# Patient Record
Sex: Male | Born: 1985 | Race: Black or African American | Hispanic: No | Marital: Single | Smoking: Never smoker
Health system: Southern US, Community
[De-identification: ages and names within clinical notes are randomized; demographics above are authoritative.]

## PROBLEM LIST (undated history)

## (undated) DIAGNOSIS — S72009A Fracture of unspecified part of neck of unspecified femur, initial encounter for closed fracture: Secondary | ICD-10-CM

## (undated) DIAGNOSIS — S6990XA Unspecified injury of unspecified wrist, hand and finger(s), initial encounter: Secondary | ICD-10-CM

## (undated) DIAGNOSIS — G4730 Sleep apnea, unspecified: Secondary | ICD-10-CM

## (undated) DIAGNOSIS — S42309A Unspecified fracture of shaft of humerus, unspecified arm, initial encounter for closed fracture: Secondary | ICD-10-CM

## (undated) DIAGNOSIS — F4310 Post-traumatic stress disorder, unspecified: Secondary | ICD-10-CM

---

## 2009-09-14 ENCOUNTER — Emergency Department (HOSPITAL_COMMUNITY): Admission: EM | Admit: 2009-09-14 | Discharge: 2009-09-14 | Payer: Self-pay | Admitting: Family Medicine

## 2009-09-22 ENCOUNTER — Ambulatory Visit (HOSPITAL_COMMUNITY): Admission: RE | Admit: 2009-09-22 | Discharge: 2009-09-22 | Payer: Self-pay | Admitting: Emergency Medicine

## 2009-10-09 ENCOUNTER — Ambulatory Visit (HOSPITAL_COMMUNITY): Admission: RE | Admit: 2009-10-09 | Discharge: 2009-10-09 | Payer: Self-pay | Admitting: Gastroenterology

## 2010-11-30 LAB — PROTIME-INR: Prothrombin Time: 12.3 seconds (ref 11.6–15.2)

## 2010-11-30 LAB — CBC
Hemoglobin: 13.9 g/dL (ref 13.0–17.0)
RBC: 4.49 MIL/uL (ref 4.22–5.81)
WBC: 4 10*3/uL (ref 4.0–10.5)

## 2010-11-30 LAB — APTT: aPTT: 31 seconds (ref 24–37)

## 2013-03-08 ENCOUNTER — Encounter (HOSPITAL_COMMUNITY): Payer: Self-pay | Admitting: Emergency Medicine

## 2013-03-08 ENCOUNTER — Emergency Department (INDEPENDENT_AMBULATORY_CARE_PROVIDER_SITE_OTHER)
Admission: EM | Admit: 2013-03-08 | Discharge: 2013-03-08 | Disposition: A | Discharge status: Home / Self Care | Source: Home / Self Care | Attending: Family Medicine | Admitting: Family Medicine

## 2013-03-08 DIAGNOSIS — M778 Other enthesopathies, not elsewhere classified: Secondary | ICD-10-CM

## 2013-03-08 DIAGNOSIS — M65849 Other synovitis and tenosynovitis, unspecified hand: Secondary | ICD-10-CM

## 2013-03-08 DIAGNOSIS — M65839 Other synovitis and tenosynovitis, unspecified forearm: Secondary | ICD-10-CM

## 2013-03-08 HISTORY — DX: Sleep apnea, unspecified: G47.30

## 2013-03-08 HISTORY — DX: Post-traumatic stress disorder, unspecified: F43.10

## 2013-03-08 MED ORDER — TRAMADOL HCL 50 MG PO TABS: 50.0000 mg | ORAL_TABLET | Freq: Four times a day (QID) | ORAL | Status: DC | PRN

## 2013-03-08 MED ORDER — IBUPROFEN 600 MG PO TABS: 600.0000 mg | ORAL_TABLET | Freq: Three times a day (TID) | ORAL | Status: DC | PRN

## 2013-03-08 NOTE — ED Notes (Signed)
Reports 2 week history of wrist pain.  Patient reports he was working out with [employer]. Patient lifted weights, snapped hand back, hyper extending right wrist.  Patient felt pain and heard pop.  [facility]  Told this was a sprain and given ibuprofen.  Patient continues to have pain and reports he is having difficulty when working.  Patient moves appliances all day at work.

## 2013-03-08 NOTE — ED Notes (Signed)
Verified instructions with dr Alfonse Ras.

## 2013-03-08 NOTE — ED Provider Notes (Signed)
History    CSN: [id number] Arrival date & time 03/08/13  1046  First MD Initiated Contact with Patient 03/08/13 1124     Chief Complaint  Patient presents with  . Wrist Pain   (Consider location/radiation/quality/duration/timing/severity/associated sxs/prior Treatment) HPI Comments: 27 year old  Right-handed male here complaining of right wrist pain for 2 weeks. Patient stated he was lifting weights at the gym and hyperextended his right wrist while holding a heavy load prior his pain started. He was seen at the [facility] and had x-rays that ruled out fractures. He has been taking over-the-counter ibuprofen and was wearing a over-the-counter splint but apparently is too small for patient's size. Denies swelling, redness or increased temperature. No skin breaks. 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  Past Medical History  Diagnosis Date  . Post traumatic stress disorder   . Sleep apnea    History reviewed. No pertinent past surgical history. No family history on file. History  Substance Use Topics  . Smoking status: Never Smoker   . Smokeless tobacco: Not on file  . Alcohol Use: Yes    Review of Systems  Constitutional: Negative for fever and chills.  Skin: Negative for rash and wound.  Neurological: Negative for weakness and numbness.    Allergies  Review of patient's allergies indicates no known allergies.  Home Medications   Current Outpatient Rx  Name  Route  Sig  Dispense  Refill  . SERTRALINE HCL PO   Oral   Take by mouth.         Marland Kitchen ibuprofen (ADVIL,MOTRIN) 600 MG tablet   Oral   Take 1 tablet (600 mg total) by mouth every 8 (eight) hours as needed for pain. Take with food   30 tablet   0   . traMADol (ULTRAM) 50 MG tablet   Oral   Take 1 tablet (50 mg total) by mouth every 6 (six) hours as needed for pain.   15 tablet   0    BP 132/56  Pulse 72   Temp(Src) 97.9 F (36.6 C) (Oral)  Resp 16  SpO2 99% Physical Exam  Nursing note and vitals reviewed. Constitutional: He is oriented to person, place, and time. He appears well-developed and well-nourished. No distress.  HENT:  Head: Normocephalic and atraumatic.  Cardiovascular: Normal heart sounds.   Pulmonary/Chest: Breath sounds normal.  Musculoskeletal:  Right wrist: No obvious deformity, swelling or erythema. Patient able to make a fist, abduct and adduct digits with reported minimal discomfort including  thumb opposition to other digits. No focal tenderness over the dorsal carpal or metacarpal bones. Tenderness to palpation over volar wrist surface on the ulnar side, worse with wrist flexion and extension. Intact 2 point discrimination in the dorsal and palmar aspect of the hand and fingers. Negative Finkelstain's test. Patent radial and ulnar pulses with brisk cap refill at the tip of the fingers. Patient reported pain with hand grip. Strength impressed normal despite discomfort.   Neurological: He is alert and oriented to person, place, and time.  Skin: No rash noted. He is not diaphoretic.  Or wounds    ED Course  Procedures (including critical care time) Labs Reviewed - No data to display No results found. 1. Tendinitis of wrist     MDM  Impress ulnar flexor tendinitis of the right wrist. Proper wrist splint was placed here. Prescribed ibuprofen and tramadol. Supportive  care including rehabilitation exercises and red flags that should prompt his return to medical attention discussed with patient and provided in writing. Work note for modified duty for one week. Followup with sports medicine clinic as needed.  Sharin Grave, MD 03/10/13 [phone]

## 2013-05-30 ENCOUNTER — Emergency Department (HOSPITAL_COMMUNITY)
Admission: EM | Admit: 2013-05-30 | Discharge: 2013-05-30 | Disposition: A | Discharge status: Home / Self Care | Source: Home / Self Care | Attending: Emergency Medicine | Admitting: Emergency Medicine

## 2013-05-30 ENCOUNTER — Emergency Department (INDEPENDENT_AMBULATORY_CARE_PROVIDER_SITE_OTHER): Payer: Self-pay

## 2013-05-30 ENCOUNTER — Encounter (HOSPITAL_COMMUNITY): Payer: Self-pay | Admitting: Emergency Medicine

## 2013-05-30 DIAGNOSIS — M62838 Other muscle spasm: Secondary | ICD-10-CM

## 2013-05-30 DIAGNOSIS — M542 Cervicalgia: Secondary | ICD-10-CM

## 2013-05-30 HISTORY — DX: Unspecified fracture of shaft of humerus, unspecified arm, initial encounter for closed fracture: S42.309A

## 2013-05-30 HISTORY — DX: Unspecified injury of unspecified wrist, hand and finger(s), initial encounter: S69.90XA

## 2013-05-30 HISTORY — DX: Fracture of unspecified part of neck of unspecified femur, initial encounter for closed fracture: S72.009A

## 2013-05-30 MED ORDER — TRAMADOL HCL 50 MG PO TABS: 50.0000 mg | ORAL_TABLET | Freq: Four times a day (QID) | ORAL | Status: DC | PRN

## 2013-05-30 MED ORDER — IBUPROFEN 800 MG PO TABS: 800.0000 mg | ORAL_TABLET | Freq: Three times a day (TID) | ORAL | Status: DC | PRN

## 2013-05-30 NOTE — ED Notes (Signed)
Neck pain for 1 to 1 1/2 weeks .  Patient was working out and heard "crunch" in neck and felt pain.  Pain has continued.  Reports aching pain in neck and intermittent sharp, shooting pain with any sudden movement of neck.  Sharp pain in neck and back of head.

## 2013-05-30 NOTE — ED Provider Notes (Signed)
CSN: [id number]     Arrival date & time 05/30/13  1705 History   First MD Initiated Contact with Patient 05/30/13 1748     Chief Complaint  Patient presents with  . Neck Pain   (Consider location/radiation/quality/duration/timing/severity/associated sxs/prior Treatment) HPI Comments: 27m presents for eval of neck pain.  About a week ago he was doing shrugs with 315 lb when he heard a "crunch" in his neck.  Since then, he has had pain in his neck that is worse with looking side to side.  It occasionally spasms and shoots pain out to both his shoulders.  The pain is somewhat relieved by holding the head in a neutral position and looking forward.  He denies fever, chills, headache, numbness in his hands or arms, blurry vision. He does admit to some occasional slight tingling sensation in his hands.    Patient is a 27 y.o. male presenting with neck pain.  Neck Pain Associated symptoms: no chest pain, no fever and no weakness     Past Medical History  Diagnosis Date  . Post traumatic stress disorder   . Sleep apnea   . Broken hip   . Broken arm   . Hand injury    History reviewed. No pertinent past surgical history. No family history on file. History  Substance Use Topics  . Smoking status: Never Smoker   . Smokeless tobacco: Not on file  . Alcohol Use: Yes    Review of Systems  Constitutional: Negative for fever, chills and fatigue.  HENT: Positive for neck pain. Negative for sore throat and neck stiffness.   Eyes: Negative for visual disturbance.  Respiratory: Negative for cough and shortness of breath.   Cardiovascular: Negative for chest pain, palpitations and leg swelling.  Gastrointestinal: Negative for nausea, vomiting, abdominal pain, diarrhea and constipation.  Genitourinary: Negative for dysuria, urgency, frequency and hematuria.  Musculoskeletal: Positive for myalgias. Negative for arthralgias.  Skin: Negative for rash.  Neurological: Negative for dizziness, weakness  and light-headedness.    Allergies  Review of patient's allergies indicates no known allergies.  Home Medications   Current Outpatient Rx  Name  Route  Sig  Dispense  Refill  . ibuprofen (ADVIL,MOTRIN) 800 MG tablet   Oral   Take 1 tablet (800 mg total) by mouth every 8 (eight) hours as needed for pain. Take with food   60 tablet   0   . SERTRALINE HCL PO   Oral   Take by mouth.         . traMADol (ULTRAM) 50 MG tablet   Oral   Take 1 tablet (50 mg total) by mouth every 6 (six) hours as needed for pain.   20 tablet   0    BP 143/86  Pulse 81  Temp(Src) 98.1 F (36.7 C) (Oral)  Resp 18  SpO2 98% Physical Exam  Nursing note and vitals reviewed. Constitutional: He is oriented to person, place, and time. He appears well-developed and well-nourished. No distress.  HENT:  Head: Normocephalic.  Pulmonary/Chest: Effort normal. No respiratory distress.  Musculoskeletal:       Cervical back: He exhibits decreased range of motion, tenderness, bony tenderness, pain and spasm. He exhibits no swelling and no deformity.  Normal sensation in the arms bilaterally   Neurological: He is alert and oriented to person, place, and time. Coordination normal.  Skin: Skin is warm and dry. No rash noted. He is not diaphoretic.  Psychiatric: He has a normal mood and affect. Judgment  normal.    ED Course  Procedures (including critical care time) Labs Review Labs Reviewed - No data to display Imaging Review Dg Cervical Spine Complete  05/30/2013   CLINICAL DATA:  Pain with right-sided radicular symptoms  EXAM: CERVICAL SPINE  4+ VIEWS  COMPARISON:  None.  FINDINGS: Frontal, lateral, open-mouth odontoid, bilateral oblique views were obtained. There is no fracture or spondylolisthesis. Prevertebral soft tissues and predental space regions are normal. Disk spaces appear intact. There is no appreciable exit foraminal narrowing at on the oblique views.  IMPRESSION: No fracture or appreciable  arthropathic change.   Electronically Signed   By: Bretta Bang   On: 05/30/2013 19:10    MDM   1. Neck pain   2. Neck muscle spasm    No radiographic evidence of fracture. Clinically, this is a muscle strain. Treat symptomatically with ibuprofen and tramadol, followup when necessary   Meds ordered this encounter  Medications  . ibuprofen (ADVIL,MOTRIN) 800 MG tablet    Sig: Take 1 tablet (800 mg total) by mouth every 8 (eight) hours as needed for pain. Take with food    Dispense:  60 tablet    Refill:  0  . traMADol (ULTRAM) 50 MG tablet    Sig: Take 1 tablet (50 mg total) by mouth every 6 (six) hours as needed for pain.    Dispense:  20 tablet    Refill:  0        Graylon Good, PA-C 05/31/13 2057

## 2013-05-31 NOTE — ED Provider Notes (Signed)
Medical screening examination/treatment/procedure(s) were performed by non-physician practitioner and as supervising physician I was immediately available for consultation/collaboration.  Kimimila Tauzin, M.D.  Adriane Gabbert C Christinia Lambeth, MD 05/31/13 2153 

## 2013-07-19 ENCOUNTER — Encounter (HOSPITAL_COMMUNITY): Payer: Self-pay | Admitting: Emergency Medicine

## 2013-07-19 ENCOUNTER — Emergency Department (INDEPENDENT_AMBULATORY_CARE_PROVIDER_SITE_OTHER)
Admission: EM | Admit: 2013-07-19 | Discharge: 2013-07-19 | Disposition: A | Discharge status: Home / Self Care | Source: Home / Self Care | Attending: Family Medicine | Admitting: Family Medicine

## 2013-07-19 DIAGNOSIS — J069 Acute upper respiratory infection, unspecified: Secondary | ICD-10-CM

## 2013-07-19 LAB — POCT RAPID STREP A: Streptococcus, Group A Screen (Direct): NEGATIVE

## 2013-07-19 MED ORDER — GUAIFENESIN-CODEINE 100-10 MG/5ML PO SOLN: 5.0000 mL | Freq: Four times a day (QID) | ORAL | Status: DC | PRN

## 2013-07-19 MED ORDER — SERTRALINE HCL 100 MG PO TABS: 100.0000 mg | ORAL_TABLET | Freq: Every day | ORAL | Status: AC

## 2013-07-19 MED ORDER — AZITHROMYCIN 250 MG PO TABS: 250.0000 mg | ORAL_TABLET | Freq: Every day | ORAL | Status: DC

## 2013-07-19 MED ORDER — IPRATROPIUM BROMIDE 0.03 % NA SOLN: 2.0000 | Freq: Two times a day (BID) | NASAL | Status: AC

## 2013-07-19 NOTE — ED Notes (Signed)
C/o cold sx for a week States he is congested and has been coughing with a little production Does have sweats and chills OTC medications taken but no relief

## 2013-07-19 NOTE — ED Provider Notes (Signed)
Jeremiah Aguilar is a 27 y.o. male who presents to Urgent Care today for cough congestion and nasal discharge present for about one week. Patient notes some chills at night but denies any fevers. No nausea vomiting or diarrhea. No significant trouble breathing. He has tried over-the-counter cough medications which helped a bit. He feels well otherwise.   Additionally patient was recently discharged from the [facility]. He has a history of PTSD and has run out of his Zoloft. He feels well but cannot be seen by his psychiatrist for a few weeks.   Past Medical History  Diagnosis Date  . Post traumatic stress disorder   . Sleep apnea   . Broken hip   . Broken arm   . Hand injury    History  Substance Use Topics  . Smoking status: Never Smoker   . Smokeless tobacco: Not on file  . Alcohol Use: Yes   ROS as above Medications reviewed. No current facility-administered medications for this encounter.   Current Outpatient Prescriptions  Medication Sig Dispense Refill  . azithromycin (ZITHROMAX) 250 MG tablet Take 1 tablet (250 mg total) by mouth daily. Take first 2 tablets together, then 1 every day until finished.  6 tablet  0  . guaiFENesin-codeine 100-10 MG/5ML syrup Take 5 mLs by mouth every 6 (six) hours as needed for cough.  120 mL  0  . ibuprofen (ADVIL,MOTRIN) 800 MG tablet Take 1 tablet (800 mg total) by mouth every 8 (eight) hours as needed for pain. Take with food  60 tablet  0  . ipratropium (ATROVENT) 0.03 % nasal spray Place 2 sprays into the nose every 12 (twelve) hours.  30 mL  1  . sertraline (ZOLOFT) 100 MG tablet Take 1 tablet (100 mg total) by mouth daily.  30 tablet  0    Exam:  BP 135/79  Pulse 94  Temp(Src) 97.7 F (36.5 C) (Oral)  Resp 16  SpO2 95% Gen: Well NAD HEENT: EOMI,  MMM, cobblestoning of posterior pharynx. Tympanic membranes are normal appearing bilaterally Lungs: CTABL Nl WOB Heart: RRR no MRG Abd: NABS, NT, ND Exts: Non edematous BL  LE, warm and  well perfused.  Psych: Alert and oriented normal affect normal speech thought process is linear and goal-directed. No SI or HI expressed.  Results for orders placed during the hospital encounter of 07/19/13 (from the past 24 hour(s))  POCT RAPID STREP A ([facility] ONLY)     Status: None   Collection Time    07/19/13  9:28 AM      Result Value Range   Streptococcus, Group A Screen (Direct) NEGATIVE  NEGATIVE   No results found.  Assessment and Plan: 27 y.o. male with  1) bronchitis with upper respiratory infection. Patient may have a bacterial pneumonia however his lung exam is relatively benign. Plan to treat with azithromycin as well as codeine containing cough medication and Atrovent nasal spray. Will use over-the-counter Tylenol or ibuprofen as needed as well.  2) PTSD: Refill Zoloft for one month. Recommend followup with psychiatrist.  Discussed warning signs or symptoms. Please see discharge instructions. Patient expresses understanding.      Rodolph Bong, MD 07/19/13 1046

## 2013-07-21 LAB — CULTURE, GROUP A STREP

## 2013-08-05 ENCOUNTER — Emergency Department (HOSPITAL_COMMUNITY): Payer: Non-veteran care

## 2013-08-05 ENCOUNTER — Encounter (HOSPITAL_COMMUNITY): Payer: Self-pay | Admitting: Emergency Medicine

## 2013-08-05 ENCOUNTER — Emergency Department (INDEPENDENT_AMBULATORY_CARE_PROVIDER_SITE_OTHER)
Admission: EM | Admit: 2013-08-05 | Discharge: 2013-08-05 | Disposition: A | Discharge status: Home / Self Care | Payer: Self-pay | Source: Home / Self Care | Attending: Emergency Medicine | Admitting: Emergency Medicine

## 2013-08-05 DIAGNOSIS — S9030XA Contusion of unspecified foot, initial encounter: Secondary | ICD-10-CM

## 2013-08-05 DIAGNOSIS — S9031XA Contusion of right foot, initial encounter: Secondary | ICD-10-CM

## 2013-08-05 MED ORDER — IBUPROFEN 600 MG PO TABS: 600.0000 mg | ORAL_TABLET | Freq: Four times a day (QID) | ORAL | Status: DC | PRN

## 2013-08-05 NOTE — ED Provider Notes (Signed)
Medical screening examination/treatment/procedure(s) were performed by non-physician practitioner and as supervising physician I was immediately available for consultation/collaboration.  Leslee Home, M.D.  Reuben Likes, MD 08/05/13 [phone]

## 2013-08-05 NOTE — ED Notes (Signed)
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 States is able to move foot. Ice treatment and advil was taking for treatment The foot was bruised but that has went away

## 2013-08-05 NOTE — ED Provider Notes (Signed)
CSN: [id number]     Arrival date & time 08/05/13  1404 History   First MD Initiated Contact with Patient 08/05/13 1502     Chief Complaint  Patient presents with  . Foot Injury   (Consider location/radiation/quality/duration/timing/severity/associated sxs/prior Treatment) HPI Comments: Patient presents for evaluation of injury to right foot that occurred 2 weeks ago. States while at the gym he accidentally dropped a weight plate on his right foot. While he does endorse that his symptoms have improved, he states he still has some discomfort with ambulation. Denies previous injury or surgery.   The history is provided by the patient.    Past Medical History  Diagnosis Date  . Post traumatic stress disorder   . Sleep apnea   . Broken hip   . Broken arm   . Hand injury    History reviewed. No pertinent past surgical history. History reviewed. No pertinent family history. History  Substance Use Topics  . Smoking status: Never Smoker   . Smokeless tobacco: Not on file  . Alcohol Use: Yes    Review of Systems  Constitutional: Negative.   Respiratory: Negative.   Cardiovascular: Negative.   Gastrointestinal: Negative.   Musculoskeletal:       See HPI  Skin: Negative.   Psychiatric/Behavioral: Negative.     Allergies  Review of patient's allergies indicates no known allergies.  Home Medications   Current Outpatient Rx  Name  Route  Sig  Dispense  Refill  . azithromycin (ZITHROMAX) 250 MG tablet   Oral   Take 1 tablet (250 mg total) by mouth daily. Take first 2 tablets together, then 1 every day until finished.   6 tablet   0   . guaiFENesin-codeine 100-10 MG/5ML syrup   Oral   Take 5 mLs by mouth every 6 (six) hours as needed for cough.   120 mL   0   . ibuprofen (ADVIL,MOTRIN) 600 MG tablet   Oral   Take 1 tablet (600 mg total) by mouth every 6 (six) hours as needed.   30 tablet   0   . ipratropium (ATROVENT) 0.03 % nasal spray   Nasal   Place 2 sprays  into the nose every 12 (twelve) hours.   30 mL   1   . sertraline (ZOLOFT) 100 MG tablet   Oral   Take 1 tablet (100 mg total) by mouth daily.   30 tablet   0    BP 123/72  Pulse 90  Temp(Src) 97.8 F (36.6 C) (Oral)  Resp 19  SpO2 96% Physical Exam  Constitutional: He is oriented to person, place, and time. He appears well-developed and well-nourished. No distress.  +Ambulatory  HENT:  Head: Normocephalic and atraumatic.  Eyes: Conjunctivae are normal.  Pulmonary/Chest: Effort normal and breath sounds normal.  Musculoskeletal:       Right ankle: Normal.       Feet:  Neurological: He is alert and oriented to person, place, and time.  Skin: Skin is warm and dry. No erythema.  No STS or ecchymosis    ED Course  Procedures (including critical care time) Labs Review Labs Reviewed - No data to display Imaging Review Dg Foot Complete Right  08/05/2013   CLINICAL DATA:  Crush injury to right foot.  EXAM: RIGHT FOOT COMPLETE - 3+ VIEW  COMPARISON:  No priors.  FINDINGS: Multiple views of the right foot demonstrate no acute displaced fracture, subluxation, dislocation, or soft tissue abnormality.  IMPRESSION: No acute radiographic  abnormality of the right foot.   Electronically Signed   By: Trudie Reed M.D.   On: 08/05/2013 15:16    EKG Interpretation    Date/Time:    Ventricular Rate:    PR Interval:    QRS Duration:   QT Interval:    QTC Calculation:   R Axis:     Text Interpretation:              MDM   1. Contusion, foot, right, initial encounter    Right foot xrays negative for acute injury    Ardis Rowan, [place] 08/05/13 [phone]

## 2013-11-01 ENCOUNTER — Emergency Department (INDEPENDENT_AMBULATORY_CARE_PROVIDER_SITE_OTHER): Payer: Self-pay

## 2013-11-01 ENCOUNTER — Encounter (HOSPITAL_COMMUNITY): Payer: Self-pay | Admitting: Emergency Medicine

## 2013-11-01 ENCOUNTER — Emergency Department (INDEPENDENT_AMBULATORY_CARE_PROVIDER_SITE_OTHER)
Admission: EM | Admit: 2013-11-01 | Discharge: 2013-11-01 | Disposition: A | Discharge status: Home / Self Care | Payer: Self-pay | Source: Home / Self Care | Attending: Emergency Medicine | Admitting: Emergency Medicine

## 2013-11-01 DIAGNOSIS — S2329XA Dislocation of other parts of thorax, initial encounter: Secondary | ICD-10-CM

## 2013-11-01 DIAGNOSIS — S2341XA Sprain of ribs, initial encounter: Secondary | ICD-10-CM

## 2013-11-01 DIAGNOSIS — J069 Acute upper respiratory infection, unspecified: Secondary | ICD-10-CM

## 2013-11-01 MED ORDER — IPRATROPIUM BROMIDE 0.06 % NA SOLN: 2.0000 | Freq: Four times a day (QID) | NASAL | Status: AC

## 2013-11-01 MED ORDER — HYDROCODONE-ACETAMINOPHEN 5-325 MG PO TABS: ORAL_TABLET | ORAL | Status: DC

## 2013-11-01 MED ORDER — IBUPROFEN 800 MG PO TABS
800.0000 mg | ORAL_TABLET | Freq: Once | ORAL | Status: AC
  Administered 2013-11-01: 800 mg via ORAL

## 2013-11-01 MED ORDER — IBUPROFEN 800 MG PO TABS
ORAL_TABLET | ORAL | Status: AC
  Filled 2013-11-01: qty 1

## 2013-11-01 MED ORDER — GUAIFENESIN-CODEINE 100-10 MG/5ML PO SYRP: 10.0000 mL | ORAL_SOLUTION | Freq: Four times a day (QID) | ORAL | Status: DC | PRN

## 2013-11-01 MED ORDER — MELOXICAM 15 MG PO TABS
15.0000 mg | ORAL_TABLET | Freq: Every day | ORAL | Status: DC
Start: 2013-11-01 — End: 2015-03-29

## 2013-11-01 NOTE — ED Notes (Signed)
Pt  Reports  Symptoms  Of   Cough   Congested           Sneezing         For       Several  Days             He  Reports  Sneezed  Several  Days  Ago  And  Felt a  Pop  In l  Side      He   Reports    Pain  whenn  He  Moves  /  Coughs  And  Or  Sneezes     Pt    Awake  And  Alert and  Ambulatory

## 2013-11-01 NOTE — ED Provider Notes (Signed)
Chief Complaint   Chief Complaint  Patient presents with  . URI    History of Present Illness    Jeremiah Aguilar is a 28 year old male who was weight lifting about a week ago when he felt something pull in his left, lower, anterolateral chest area. This didn't bother him too much up until yesterday when he was chopping some wood and sneezed at the same time. He felt the sudden sharp pain in that same area and noted a sensation of a pop. Ever since then he's had pain in that area which is worse with palpation, laughing, sneezing, coughing, or movement but not with deep inspiration. He denies any shortness of breath, fever, chills, or hemoptysis. Over the past 2 days he's had some mild viral URI symptoms with nasal congestion with clear drainage, sinus pressure, loose stools, and cough productive of small amounts of yellow sputum.  Review of Systems    Other than noted above, the patient denies any of the following symptoms. Systemic:  No fever, chills, sweats, or fatigue. ENT:  No nasal congestion, rhinorrhea, or sore throat. Pulmonary:  No cough, wheezing, shortness of breath, sputum production, hemoptysis. Cardiac:  No palpitations, rapid heartbeat, dizziness, presyncope or syncope. GI:  No abdominal pain, heartburn, nausea, or vomiting. Ext:  No leg pain or swelling.  PMFSH    Past medical history, family history, social history, meds, and allergies were reviewed and updated as needed. He takes Zoloft for PTSD and he has obstructive sleep apnea.  Physical Exam     Vital signs:  BP 140/70  Pulse 72  Temp(Src) 98.6 F (37 C) (Oral)  Resp 16  SpO2 100% Gen:  Alert, oriented, in no distress, skin warm and dry. Eye:  PERRL, lids and conjunctivas normal.  Sclera non-icteric. ENT:  Mucous membranes moist, pharynx clear. Neck:  Supple, no adenopathy or tenderness.  No JVD. Lungs:  Clear to auscultation, no wheezes, rales or rhonchi.  No respiratory distress. Heart:  Regular rhythm.   No gallops, murmers, clicks or rubs. Chest:  He has tenderness to palpation in the left, lower, anterolateral chest area. No swelling, bruising, or deformity. Abdomen:  Soft, nontender, no organomegaly or mass.  Bowel sounds normal.  No pulsatile abdominal mass or bruit. Ext:  No edema.  No calf tenderness and Homann's sign negative.  Pulses full and equal. Skin:  Warm and dry.  No rash.  Radiology     Dg Ribs Unilateral W/chest Left  11/01/2013   CLINICAL DATA:  Pain  EXAM: LEFT RIBS AND CHEST - 3+ VIEW  COMPARISON:  None.  FINDINGS: In addition to frontal chest, bilateral oblique and cone-down lower rib images were obtained. Lungs are clear. Heart size and pulmonary vascularity are normal. No adenopathy.  No pneumothorax or effusion.  No demonstrable rib fracture.  IMPRESSION: No demonstrable rib fracture.  Lungs clear.   Electronically Signed   By: Bretta BangWilliam  Woodruff M.D.   On: 11/01/2013 11:01   I reviewed the images independently and personally and concur with the radiologist's findings.  Course in Urgent Care Center   He was given Motrin 800 mg by mouth for the pain.  Assessment     The primary encounter diagnosis was Costochondral separation. A diagnosis of Viral upper respiratory infection was also pertinent to this visit.  Plan     1.  Meds:  The following meds were prescribed:   New Prescriptions   GUAIFENESIN-CODEINE (GUIATUSS AC) 100-10 MG/5ML SYRUP    Take 10 mLs  by mouth 4 (four) times daily as needed for cough.   HYDROCODONE-ACETAMINOPHEN (NORCO/VICODIN) 5-325 MG PER TABLET    1 to 2 tabs every 4 to 6 hours as needed for pain.   IPRATROPIUM (ATROVENT) 0.06 % NASAL SPRAY    Place 2 sprays into both nostrils 4 (four) times daily.   MELOXICAM (MOBIC) 15 MG TABLET    Take 1 tablet (15 mg total) by mouth daily.    2.  Patient Education/Counseling:  The patient was given appropriate handouts, self care instructions, and instructed in symptomatic relief.    3.  Follow up:  The  patient was told to follow up here if no better in 3 to 4 days, or sooner if becoming worse in any way, and give an an some red flag symptoms such as worsening pain, shortness of breath, dizziness, or passing out which would prompt immediate return.      Reuben Likes, MD 11/01/13 1140

## 2013-11-01 NOTE — Discharge Instructions (Signed)
Rib Fracture A rib fracture is a break or crack in one of the bones of the ribs. The ribs are a group of long, curved bones that wrap around your chest and attach to your spine. They protect your lungs and other organs in the chest cavity. A broken or cracked rib is often painful, but most do not cause other problems. Most rib fractures heal on their own over time. However, rib fractures can be more serious if multiple ribs are broken or if broken ribs move out of place and push against other structures. CAUSES   A direct blow to the chest. For example, this could happen during contact sports, a car accident, or a fall against a hard object.  Repetitive movements with high force, such as pitching a baseball or having severe coughing spells. SYMPTOMS   Pain when you breathe in or cough.  Pain when someone presses on the injured area. DIAGNOSIS  Your caregiver will perform a physical exam. Various imaging tests may be ordered to confirm the diagnosis and to look for related injuries. These tests may include a chest X-ray, computed tomography (CT), magnetic resonance imaging (MRI), or a bone scan. TREATMENT  Rib fractures usually heal on their own in 1 3 months. The longer healing period is often associated with a continued cough or other aggravating activities. During the healing period, pain control is very important. Medication is usually given to control pain. Hospitalization or surgery may be needed for more severe injuries, such as those in which multiple ribs are broken or the ribs have moved out of place.  HOME CARE INSTRUCTIONS   Avoid strenuous activity and any activities or movements that cause pain. Be careful during activities and avoid bumping the injured rib.  Gradually increase activity as directed by your caregiver.  Only take over-the-counter or prescription medications as directed by your caregiver. Do not take other medications without asking your caregiver first.  Apply ice  to the injured area for the first 1 2 days after you have been treated or as directed by your caregiver. Applying ice helps to reduce inflammation and pain.  Put ice in a plastic bag.  Place a towel between your skin and the bag.   Leave the ice on for 15 20 minutes at a time, every 2 hours while you are awake.  Perform deep breathing as directed by your caregiver. This will help prevent pneumonia, which is a common complication of a broken rib. Your caregiver may instruct you to:  Take deep breaths several times a day.  Try to cough several times a day, holding a pillow against the injured area.  Use a device called an incentive spirometer to practice deep breathing several times a day.  Drink enough fluids to keep your urine clear or pale yellow. This will help you avoid constipation.   Do not wear a rib belt or binder. These restrict breathing, which can lead to pneumonia.  SEEK IMMEDIATE MEDICAL CARE IF:   You have a fever.   You have difficulty breathing or shortness of breath.   You develop a continual cough, or you cough up thick or bloody sputum.  You feel sick to your stomach (nausea), throw up (vomit), or have abdominal pain.   You have worsening pain not controlled with medications.  MAKE SURE YOU:  Understand these instructions.  Will watch your condition.  Will get help right away if you are not doing well or get worse. Document Released: 08/30/2005 Document Revised:  05/02/2013 Document Reviewed: 11/01/2012 Exit[employer] Patient Information 2014 [place], [place].  Most upper respiratory infections are caused by viruses and do not require antibiotics.  We try to save the antibiotics for when we really need them to prevent bacteria from developing resistance to them.  Here are a few hints about things that can be done at home to help get over an upper respiratory infection quicker:  Get extra sleep and extra fluids.  Get 7 to 9 hours of sleep per night and 6 to 8  glasses of water a day.  Getting extra sleep keeps the immune system from getting run down.  Most people with an upper respiratory infection are a little dehydrated.  The extra fluids also keep the secretions liquified and easier to deal with.  Also, get extra vitamin C.  4000 mg per day is the recommended dose. For the aches, headache, and fever, acetaminophen or ibuprofen are helpful.  These can be alternated every 4 hours.  People with liver disease should avoid large amounts of acetaminophen, and people with ulcer disease, gastroesophageal reflux, gastritis, congestive heart failure, chronic kidney disease, coronary artery disease and the elderly should avoid ibuprofen. For nasal congestion try Mucinex-D, or if you're having lots of sneezing or clear nasal drainage use Zyrtec-D. People with high blood pressure can take these if their blood pressure is controlled, if not, it's best to avoid the forms with a "D" (decongestants).  You can use the plain Mucinex, Allegra, Claritin, or Zyrtec even if your blood pressure is not controlled.   A Saline nasal spray such as Ocean Spray can also help.  You can add a decongestant sprays such as Afrin, but you should not use the decongestant sprays for more than 3 or 4 days since they can be habituating.  Breathe Rite nasal strips can also offer a non-drug alternative treatment to nasal congestion, especially at night. For people with symptoms of sinusitis, sleeping with your head elevated can be helpful.  For sinus pain, moist, hot compresses to the face may provide some relief.  Many people find that inhaling steam as in a shower or from a pot of steaming water can help. For any viral infection, zinc containing lozenges such as Cold-Eze or Zicam are helpful.  Zinc helps to fight viral infection.  Hot salt water gargles (8 oz of hot water, 1/2 tsp of table salt, and a pinch of baking soda) can give relief as well as hot beverages such as hot tea.  Sucrets extra strength  lozenges will help the sore throat.  For the cough, take Delsym 2 tsp every 12 hours.  It has also been found recently that Aleve can help control a cough.  The dose is 1 to 2 tablets twice daily with food.  This can be combined with Delsym. (Note, if you are taking ibuprofen, you should not take Aleve as well--take one or the other.) A cool mist vaporizer will help keep your mucous membranes from drying out.   It's important when you have an upper respiratory infection not to pass the infection to others.  This involves being very careful about the following:  Frequent hand washing or use of hand sanitizer, especially after coughing, sneezing, blowing your nose or touching your face, nose or eyes. Do not shake hands or touch anyone and try to avoid touching surfaces that other people use such as doorknobs, shopping carts, telephones and computer keyboards. Use tissues and dispose of them properly in a garbage can or  ziplock bag. Cough into your sleeve. Do not let others eat or drink after you.  It's also important to recognize the signs of serious illness and get evaluated if they occur: Any respiratory infection that lasts more than 7 to 10 days.  Yellow nasal drainage and sputum are not reliable indicators of a bacterial infection, but if they last for more than 1 week, see your doctor. Fever and sore throat can indicate strep. Fever and cough can indicate influenza or pneumonia. Any kind of severe symptom such as difficulty breathing, intractable vomiting, or severe pain should prompt you to see a doctor as soon as possible.   Your body's immune system is really the thing that will get rid of this infection.  Your immune system is comprised of 2 types of specialized cells called T cells and B cells.  T cells coordinate the array of cells in your body that engulf invading bacteria or viruses while B cells orchestrate the production of antibodies that neutralize infection.  Anything we do or any  medications we give you, will just strengthen your immune system or help it clear up the infection quicker.  Here are a few helpful hints to improve your immune system to help overcome this illness or to prevent future infections:  A few vitamins can improve the health of your immune system.  That's why your diet should include plenty of fruits, vegetables, fish, nuts, and whole grains.  Vitamin A and bet-carotene can increase the cells that fight infections (T cells and B cells).  Vitamin A is abundant in dark greens and orange vegetables such as spinach, greens, sweet potatoes, and carrots.  Vitamin B6 contributes to the maturation of white blood cells, the cells that fight disease.  Foods with vitamin B6 include cold cereal and bananas.  Vitamin C is credited with preventing colds because it increases white blood cells and also prevents cellular damage.  Citrus fruits, peaches and green and red bell peppers are all hight in vitamin C.  Vitamin E is an anti-oxidant that encourages the production of natural killer cells which reject foreign invaders and B cells that produce antibodies.  Foods high in vitamin E include wheat germ, nuts and seeds.  Foods high in omega-3 fatty acids found in foods like salmon, tuna and mackerel boost your immune system and help cells to engulf and absorb germs.  Probiotics are good bacteria that increase your T cells.  These can be found in yogurt and are available in supplements such as Culturelle or Align.  Moderate exercise increases the strength of your immune system and your ability to recover from illness.  I suggest 3 to 5 moderate intensity 30 minute workouts per week.    Sleep is another component of maintaining a strong immune system.  It enables your body to recuperate from the day's activities, stress and work.  My recommendation is to get between 7 and 9 hours of sleep per night.  If you smoke, try to quit completely or at least cut down.  Drink  alcohol only in moderation if at all.  No more than 2 drinks daily for men or 1 for women.  Get a flu vaccine early in the fall or if you have not gotten one yet, once this illness has run its course.  If you are over 65, a smoker, or an asthmatic, get a pneumococcal vaccine.  My final recommendation is to maintain a healthy weight.  Excess weight can impair the immune system by  interfering with the way the immune system deals with invading viruses or bacteria.

## 2014-02-07 ENCOUNTER — Ambulatory Visit: Payer: Non-veteran care | Attending: Student

## 2014-02-07 DIAGNOSIS — M25559 Pain in unspecified hip: Secondary | ICD-10-CM | POA: Insufficient documentation

## 2014-02-07 DIAGNOSIS — R262 Difficulty in walking, not elsewhere classified: Secondary | ICD-10-CM | POA: Insufficient documentation

## 2014-02-07 DIAGNOSIS — IMO0001 Reserved for inherently not codable concepts without codable children: Secondary | ICD-10-CM | POA: Insufficient documentation

## 2014-02-12 ENCOUNTER — Encounter: Payer: Non-veteran care | Admitting: Rehabilitation

## 2014-02-19 ENCOUNTER — Ambulatory Visit: Payer: Non-veteran care | Attending: Student | Admitting: Physical Therapy

## 2014-02-19 DIAGNOSIS — IMO0001 Reserved for inherently not codable concepts without codable children: Secondary | ICD-10-CM | POA: Insufficient documentation

## 2014-02-19 DIAGNOSIS — R262 Difficulty in walking, not elsewhere classified: Secondary | ICD-10-CM | POA: Diagnosis not present

## 2014-02-19 DIAGNOSIS — M25559 Pain in unspecified hip: Secondary | ICD-10-CM | POA: Insufficient documentation

## 2014-02-21 ENCOUNTER — Encounter: Payer: Non-veteran care | Admitting: Physical Therapy

## 2014-02-26 ENCOUNTER — Encounter: Payer: Non-veteran care | Admitting: Rehabilitation

## 2014-02-28 ENCOUNTER — Ambulatory Visit: Payer: Non-veteran care | Admitting: Rehabilitation

## 2015-03-29 ENCOUNTER — Encounter (HOSPITAL_COMMUNITY): Payer: Self-pay | Admitting: Emergency Medicine

## 2015-03-29 ENCOUNTER — Emergency Department (HOSPITAL_COMMUNITY): Payer: Non-veteran care

## 2015-03-29 ENCOUNTER — Emergency Department (HOSPITAL_COMMUNITY)
Admission: EM | Admit: 2015-03-29 | Discharge: 2015-03-29 | Disposition: A | Discharge status: Home / Self Care | Payer: Non-veteran care | Attending: Emergency Medicine | Admitting: Emergency Medicine

## 2015-03-29 DIAGNOSIS — Y998 Other external cause status: Secondary | ICD-10-CM | POA: Insufficient documentation

## 2015-03-29 DIAGNOSIS — Z792 Long term (current) use of antibiotics: Secondary | ICD-10-CM | POA: Insufficient documentation

## 2015-03-29 DIAGNOSIS — Z8781 Personal history of (healed) traumatic fracture: Secondary | ICD-10-CM | POA: Insufficient documentation

## 2015-03-29 DIAGNOSIS — Y9389 Activity, other specified: Secondary | ICD-10-CM | POA: Insufficient documentation

## 2015-03-29 DIAGNOSIS — F4310 Post-traumatic stress disorder, unspecified: Secondary | ICD-10-CM | POA: Insufficient documentation

## 2015-03-29 DIAGNOSIS — S29011A Strain of muscle and tendon of front wall of thorax, initial encounter: Secondary | ICD-10-CM | POA: Insufficient documentation

## 2015-03-29 DIAGNOSIS — Z79899 Other long term (current) drug therapy: Secondary | ICD-10-CM | POA: Insufficient documentation

## 2015-03-29 DIAGNOSIS — Y9239 Other specified sports and athletic area as the place of occurrence of the external cause: Secondary | ICD-10-CM | POA: Insufficient documentation

## 2015-03-29 DIAGNOSIS — Z8669 Personal history of other diseases of the nervous system and sense organs: Secondary | ICD-10-CM | POA: Diagnosis not present

## 2015-03-29 DIAGNOSIS — X58XXXA Exposure to other specified factors, initial encounter: Secondary | ICD-10-CM | POA: Insufficient documentation

## 2015-03-29 DIAGNOSIS — S299XXA Unspecified injury of thorax, initial encounter: Secondary | ICD-10-CM | POA: Diagnosis present

## 2015-03-29 MED ORDER — HYDROCODONE-ACETAMINOPHEN 5-325 MG PO TABS: 1.0000 | ORAL_TABLET | Freq: Four times a day (QID) | ORAL | Status: DC | PRN

## 2015-03-29 MED ORDER — MELOXICAM 7.5 MG PO TABS: 15.0000 mg | ORAL_TABLET | Freq: Every day | ORAL | Status: DC

## 2015-03-29 MED ORDER — KETOROLAC TROMETHAMINE 60 MG/2ML IM SOLN
60.0000 mg | Freq: Once | INTRAMUSCULAR | Status: AC
  Administered 2015-03-29: 60 mg via INTRAMUSCULAR
  Filled 2015-03-29: qty 2

## 2015-03-29 NOTE — ED Notes (Signed)
Pt stable, ambulatory, states understanding of discharge instructions 

## 2015-03-29 NOTE — Discharge Instructions (Signed)
Recommend that you use an incentive spirometer once per hour while awake. Take Mobic as prescribed for symptoms. Apply ice to areas of injury 3-4 times per day for 15-20 minutes each time. You may take Norco as needed for severe pain. It is recommended that you significantly decrease the weight you are lifting and increase your number of repetitions until your pain subsides.  Chest Wall Pain Chest wall pain is pain in or around the bones and muscles of your chest. It may take up to 6 weeks to get better. It may take longer if you must stay physically active in your work and activities.  CAUSES  Chest wall pain may happen on its own. However, it may be caused by:  A viral illness like the flu.  Injury.  Coughing.  Exercise.  Arthritis.  Fibromyalgia.  Shingles. HOME CARE INSTRUCTIONS   Avoid overtiring physical activity. Try not to strain or perform activities that cause pain. This includes any activities using your chest or your abdominal and side muscles, especially if heavy weights are used.  Put ice on the sore area.  Put ice in a plastic bag.  Place a towel between your skin and the bag.  Leave the ice on for 15-20 minutes per hour while awake for the first 2 days.  Only take over-the-counter or prescription medicines for pain, discomfort, or fever as directed by your caregiver. SEEK IMMEDIATE MEDICAL CARE IF:   Your pain increases, or you are very uncomfortable.  You have a fever.  Your chest pain becomes worse.  You have new, unexplained symptoms.  You have nausea or vomiting.  You feel sweaty or lightheaded.  You have a cough with phlegm (sputum), or you cough up blood. MAKE SURE YOU:   Understand these instructions.  Will watch your condition.  Will get help right away if you are not doing well or get worse. Document Released: 08/30/2005 Document Revised: 11/22/2011 Document Reviewed: 04/26/2011 [facility] Patient Information 2015 [place], [place]. This  information is not intended to replace advice given to you by your health care provider. Make sure you discuss any questions you have with your health care provider.

## 2015-03-29 NOTE — ED Notes (Signed)
C/o L sided rib pain that started while lifting weights this afternoon.  Pain worse with movement.

## 2015-03-29 NOTE — ED Provider Notes (Signed)
CSN: [id number]     Arrival date & time 03/29/15  0116 History   First MD Initiated Contact with Patient 03/29/15 [phone]     Chief Complaint  Patient presents with  . rib pain     (Consider location/radiation/quality/duration/timing/severity/associated sxs/prior Treatment) HPI Comments: 29 year old male with no significant past medical history presents to the emergency department for further evaluation of left sided chest wall pain. Patient describes the pain as sharp, worse with deep inspiration. Patient has taken Tylenol for symptoms without relief. He reports that the pain began yesterday evening while he was lifting 230 pounds while at the gym. He reports sudden onset of a sharp pain sensation. This has been nonradiating since onset. No reported syncope, fever, leg swelling, shortness of breath, or direct trauma/injury to the area. Patient has a history of similar symptoms on the right side of his chest secondary to a football injury.  The history is provided by the patient. No language interpreter was used.    Past Medical History  Diagnosis Date  . Post traumatic stress disorder   . Sleep apnea   . Broken hip   . Broken arm   . Hand injury    History reviewed. No pertinent past surgical history. No family history on file. History  Substance Use Topics  . Smoking status: Never Smoker   . Smokeless tobacco: Not on file  . Alcohol Use: Yes    Review of Systems  Constitutional: Negative for fever.  Respiratory: Negative for shortness of breath.   Cardiovascular: Positive for chest pain. Negative for leg swelling.  Musculoskeletal: Positive for myalgias.  Neurological: Negative for syncope.  All other systems reviewed and are negative.   Allergies  Review of patient's allergies indicates no known allergies.  Home Medications   Prior to Admission medications   Medication Sig Start Date End Date Taking? Authorizing Provider  gabapentin (NEURONTIN) 100 MG capsule Take 100  mg by mouth as needed (for pain).   Yes Historical Provider, MD  hydrOXYzine (ATARAX/VISTARIL) 10 MG tablet Take 10 mg by mouth 3 (three) times daily as needed for itching or anxiety.   Yes Historical Provider, MD  azithromycin (ZITHROMAX) 250 MG tablet Take 1 tablet (250 mg total) by mouth daily. Take first 2 tablets together, then 1 every day until finished. Patient not taking: Reported on 03/29/2015 07/19/13   Rodolph BongEvan S Corey, MD  guaiFENesin-codeine [facility](GUIATUSS AC) 100-10 MG/5ML syrup Take 10 mLs by mouth 4 (four) times daily as needed for cough. Patient not taking: Reported on 03/29/2015 11/01/13   Reuben Likesavid C Keller, MD  guaiFENesin-codeine 100-10 MG/5ML syrup Take 5 mLs by mouth every 6 (six) hours as needed for cough. Patient not taking: Reported on 03/29/2015 07/19/13   Rodolph BongEvan S Corey, MD  HYDROcodone-acetaminophen (NORCO/VICODIN) 5-325 MG per tablet Take 1-2 tablets by mouth every 6 (six) hours as needed for severe pain. 03/29/15   Antony MaduraKelly Long Brimage, PA-C  ibuprofen (ADVIL,MOTRIN) 600 MG tablet Take 1 tablet (600 mg total) by mouth every 6 (six) hours as needed. Patient not taking: Reported on 03/29/2015 08/05/13   Mathis FareJennifer Lee H Presson, PA  ipratropium (ATROVENT) 0.03 % nasal spray Place 2 sprays into the nose every 12 (twelve) hours. Patient not taking: Reported on 03/29/2015 07/19/13   Rodolph BongEvan S Corey, MD  ipratropium (ATROVENT) 0.06 % nasal spray Place 2 sprays into both nostrils 4 (four) times daily. Patient not taking: Reported on 03/29/2015 11/01/13   Reuben Likesavid C Keller, MD  meloxicam (MOBIC) 7.5 MG tablet  Take 2 tablets (15 mg total) by mouth daily. 03/29/15   Antony Madura, PA-C  sertraline (ZOLOFT) 100 MG tablet Take 1 tablet (100 mg total) by mouth daily. Patient not taking: Reported on 03/29/2015 07/19/13   Rodolph Bong, MD   BP 123/84 mmHg  Pulse 85  Temp(Src) 98.4 F (36.9 C) (Oral)  Resp 18  Ht  (1.727 m)  Wt 246 lb (111.585 kg)  BMI 37.41 kg/m2  SpO2 97%   Physical Exam  Constitutional: He is  oriented to person, place, and time. He appears well-developed and well-nourished. No distress.  Nontoxic/nonseptic appearing. Patient pleasant.  HENT:  Head: Normocephalic and atraumatic.  Mouth/Throat: Oropharynx is clear and moist. No oropharyngeal exudate.  Eyes: Conjunctivae and EOM are normal. No scleral icterus.  Neck: Normal range of motion.  Cardiovascular: Normal rate, regular rhythm and intact distal pulses.   Pulmonary/Chest: Effort normal. No respiratory distress. He has no wheezes. He exhibits tenderness. He exhibits no bony tenderness, no crepitus and no deformity.    Respirations even and unlabored  Musculoskeletal: Normal range of motion.  Neurological: He is alert and oriented to person, place, and time. He exhibits normal muscle tone. Coordination normal.  GCS 15. Patient moving all extremities.  Skin: Skin is warm and dry. No rash noted. He is not diaphoretic. No erythema. No pallor.  Psychiatric: He has a normal mood and affect. His behavior is normal.  Nursing note and vitals reviewed.   ED Course  Procedures (including critical care time) Labs Review Labs Reviewed - No data to display  Imaging Review Dg Ribs Unilateral W/chest Left  03/29/2015   CLINICAL DATA:  29 year old male with left-sided chest pain  EXAM: LEFT RIBS AND CHEST - 3+ VIEW  COMPARISON:  02/19/ 2015  FINDINGS: No fracture or other bone lesions are seen involving the ribs. There is no evidence of pneumothorax or pleural effusion. Both lungs are clear. Heart size and mediastinal contours are within normal limits.  IMPRESSION: Negative.   Electronically Signed   By: Elgie Collard M.D.   On: 03/29/2015 02:43     EKG Interpretation None      MDM   Final diagnoses:  Chest wall muscle strain, initial encounter    29 year old male with symptoms consistent with a chest wall muscle strain/sprain. Pain reproducible on palpation of the chest wall. No crepitus or deformity. X-ray negative for  fracture, pneumothorax, or other acute process. Doubt cardiac etiology given atypical nature of symptoms and history of onset while performing heavy lifting. Will manage symptoms as outpatient with incentive spirometer, NSAIDs, and Norco. Ice advised and return precautions given. Patient agreeable to plan with no unaddressed concerns. Patient discharged in good condition.   Filed Vitals:   03/29/15 0215 03/29/15 0300 03/29/15 0315 03/29/15 0330  BP: 145/71 132/74 117/52 123/84  Pulse: 84 81 78 85  Temp:      TempSrc:      Resp:      Height:      Weight:      SpO2: 98% 100% 100% 97%     Antony Madura, PA-C 03/29/15 0649  Layla Maw Ward, DO 03/29/15 [phone]

## 2015-05-20 ENCOUNTER — Encounter (HOSPITAL_COMMUNITY): Payer: Self-pay | Admitting: Emergency Medicine

## 2015-05-20 ENCOUNTER — Emergency Department (HOSPITAL_COMMUNITY)
Admission: EM | Admit: 2015-05-20 | Discharge: 2015-05-21 | Disposition: A | Discharge status: Home / Self Care | Payer: Non-veteran care | Attending: Emergency Medicine | Admitting: Emergency Medicine

## 2015-05-20 ENCOUNTER — Emergency Department (HOSPITAL_COMMUNITY): Payer: Non-veteran care

## 2015-05-20 DIAGNOSIS — S299XXA Unspecified injury of thorax, initial encounter: Secondary | ICD-10-CM | POA: Diagnosis present

## 2015-05-20 DIAGNOSIS — Y9389 Activity, other specified: Secondary | ICD-10-CM | POA: Diagnosis not present

## 2015-05-20 DIAGNOSIS — W108XXA Fall (on) (from) other stairs and steps, initial encounter: Secondary | ICD-10-CM | POA: Insufficient documentation

## 2015-05-20 DIAGNOSIS — Z79899 Other long term (current) drug therapy: Secondary | ICD-10-CM | POA: Insufficient documentation

## 2015-05-20 DIAGNOSIS — Y9289 Other specified places as the place of occurrence of the external cause: Secondary | ICD-10-CM | POA: Diagnosis not present

## 2015-05-20 DIAGNOSIS — M542 Cervicalgia: Secondary | ICD-10-CM

## 2015-05-20 DIAGNOSIS — Z8669 Personal history of other diseases of the nervous system and sense organs: Secondary | ICD-10-CM | POA: Diagnosis not present

## 2015-05-20 DIAGNOSIS — M1612 Unilateral primary osteoarthritis, left hip: Secondary | ICD-10-CM | POA: Diagnosis not present

## 2015-05-20 DIAGNOSIS — R0789 Other chest pain: Secondary | ICD-10-CM

## 2015-05-20 DIAGNOSIS — F4310 Post-traumatic stress disorder, unspecified: Secondary | ICD-10-CM | POA: Insufficient documentation

## 2015-05-20 DIAGNOSIS — Y998 Other external cause status: Secondary | ICD-10-CM | POA: Insufficient documentation

## 2015-05-20 DIAGNOSIS — S199XXA Unspecified injury of neck, initial encounter: Secondary | ICD-10-CM | POA: Diagnosis not present

## 2015-05-20 DIAGNOSIS — S79912A Unspecified injury of left hip, initial encounter: Secondary | ICD-10-CM | POA: Diagnosis not present

## 2015-05-20 DIAGNOSIS — M25552 Pain in left hip: Secondary | ICD-10-CM

## 2015-05-20 MED ORDER — HYDROCODONE-ACETAMINOPHEN 5-325 MG PO TABS
2.0000 | ORAL_TABLET | Freq: Once | ORAL | Status: AC
Start: 2015-05-20 — End: 2015-05-20
  Administered 2015-05-20: 2 via ORAL
  Filled 2015-05-20: qty 2

## 2015-05-20 NOTE — ED Notes (Signed)
The patient is a [employer], [employer]/gallery and he "took a suspect down a flight of stairs".  He said he is having pain in his ribs and his hip.  He also says he is having neck pain too.  He rates his pain 8/10.  He denies hitting his head or any LOC.
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
## 2015-05-20 NOTE — ED Notes (Signed)
Kayla PA at bedside.  

## 2015-05-21 ENCOUNTER — Emergency Department (HOSPITAL_COMMUNITY): Payer: Non-veteran care

## 2015-05-21 ENCOUNTER — Encounter (HOSPITAL_COMMUNITY): Payer: Self-pay

## 2015-05-21 MED ORDER — METHOCARBAMOL 500 MG PO TABS: 500.0000 mg | ORAL_TABLET | Freq: Four times a day (QID) | ORAL | Status: DC | PRN

## 2015-05-21 MED ORDER — MELOXICAM 7.5 MG PO TABS: 7.5000 mg | ORAL_TABLET | Freq: Every day | ORAL | Status: AC

## 2015-05-21 MED ORDER — METHOCARBAMOL 500 MG PO TABS
1000.0000 mg | ORAL_TABLET | Freq: Once | ORAL | Status: AC
  Administered 2015-05-21: 1000 mg via ORAL
  Filled 2015-05-21: qty 2

## 2015-05-21 NOTE — ED Notes (Signed)
Patient transported to CT 

## 2015-05-21 NOTE — Discharge Instructions (Signed)
°  Read the information below.  Use the prescribed medication as directed.  Please discuss all new medications with your pharmacist.  You may return to the Emergency Department at any time for worsening condition or any new symptoms that concern you.  If you develop uncontrolled pain, weakness or numbness of the extremity, severe discoloration of the skin, or you are unable to walk, return to the ER for a recheck.     You have been diagnosed by your caregiver as having chest wall pain. SEEK IMMEDIATE MEDICAL ATTENTION IF: You develop a fever.  Your chest pains become severe or intolerable.  You develop new, unexplained symptoms (problems).  You develop shortness of breath, nausea, vomiting, sweating or feel light headed.  You develop a new cough or you cough up blood.

## 2015-05-21 NOTE — ED Notes (Signed)
Emily PA at bedside

## 2015-06-09 ENCOUNTER — Emergency Department (INDEPENDENT_AMBULATORY_CARE_PROVIDER_SITE_OTHER)
Admission: EM | Admit: 2015-06-09 | Discharge: 2015-06-09 | Disposition: A | Discharge status: Home / Self Care | Payer: Non-veteran care | Source: Home / Self Care | Attending: Family Medicine | Admitting: Family Medicine

## 2015-06-09 ENCOUNTER — Encounter (HOSPITAL_COMMUNITY): Payer: Self-pay | Admitting: *Deleted

## 2015-06-09 DIAGNOSIS — R0789 Other chest pain: Secondary | ICD-10-CM | POA: Diagnosis not present

## 2015-06-09 MED ORDER — IBUPROFEN 800 MG PO TABS: 800.0000 mg | ORAL_TABLET | Freq: Three times a day (TID) | ORAL | Status: AC

## 2015-06-09 MED ORDER — METHOCARBAMOL 500 MG PO TABS: 500.0000 mg | ORAL_TABLET | Freq: Three times a day (TID) | ORAL | Status: DC

## 2015-06-09 NOTE — Discharge Instructions (Signed)
therma-care patches, use medicine as needed, allow to heal before resuming that exercise, do other workout as tolerated.

## 2015-06-09 NOTE — ED Provider Notes (Signed)
CSN: [id number]     Arrival date & time 06/09/15  1500 History   First MD Initiated Contact with Patient 06/09/15 1648     Chief Complaint  Patient presents with  . Chest Pain   (Consider location/radiation/quality/duration/timing/severity/associated sxs/prior Treatment) Patient is a 29 y.o. male presenting with chest pain. The history is provided by the patient.  Chest Pain Pain location:  L lateral chest Pain quality: sharp and shooting   Pain radiates to:  Does not radiate Pain radiates to the back: no   Pain severity:  Moderate Onset quality:  Sudden Duration:  2 days Progression:  Unchanged Chronicity:  New Context comment:  Trying to power lit 320 lbs and felt sudden pop in left ax chest. Relieved by:  Certain positions Worsened by:  Certain positions Associated symptoms: no abdominal pain, no fever, no palpitations, no shortness of breath and no weakness     Past Medical History  Diagnosis Date  . Post traumatic stress disorder   . Sleep apnea   . Broken hip   . Broken arm   . Hand injury    History reviewed. No pertinent past surgical history. History reviewed. No pertinent family history. Social History  Substance Use Topics  . Smoking status: Never Smoker   . Smokeless tobacco: None  . Alcohol Use: Yes    Review of Systems  Constitutional: Negative for fever.  Respiratory: Negative for shortness of breath.   Cardiovascular: Positive for chest pain. Negative for palpitations.  Gastrointestinal: Negative.  Negative for abdominal pain.  Musculoskeletal: Negative.   Skin: Negative.   Neurological: Negative for weakness.  All other systems reviewed and are negative.   Allergies  Review of patient's allergies indicates no known allergies.  Home Medications   Prior to Admission medications   Medication Sig Start Date End Date Taking? Authorizing Provider  azithromycin (ZITHROMAX) 250 MG tablet Take 1 tablet (250 mg total) by mouth daily. Take first 2  tablets together, then 1 every day until finished. Patient not taking: Reported on 03/29/2015 07/19/13   Rodolph Bong, MD  gabapentin (NEURONTIN) 100 MG capsule Take 100 mg by mouth as needed (for pain).    Historical Provider, MD  guaiFENesin-codeine (GUIATUSS AC) 100-10 MG/5ML syrup Take 10 mLs by mouth 4 (four) times daily as needed for cough. Patient not taking: Reported on 03/29/2015 11/01/13   Reuben Likes, MD  guaiFENesin-codeine 100-10 MG/5ML syrup Take 5 mLs by mouth every 6 (six) hours as needed for cough. Patient not taking: Reported on 03/29/2015 07/19/13   Rodolph Bong, MD  HYDROcodone-acetaminophen (NORCO/VICODIN) 5-325 MG per tablet Take 1-2 tablets by mouth every 6 (six) hours as needed for severe pain. Patient not taking: Reported on 05/20/2015 03/29/15   Antony Madura, PA-C  ibuprofen (ADVIL,MOTRIN) 800 MG tablet Take 1 tablet (800 mg total) by mouth 3 (three) times daily. 06/09/15   Linna Hoff, MD  ipratropium (ATROVENT) 0.03 % nasal spray Place 2 sprays into the nose every 12 (twelve) hours. Patient not taking: Reported on 03/29/2015 07/19/13   Rodolph Bong, MD  ipratropium (ATROVENT) 0.06 % nasal spray Place 2 sprays into both nostrils 4 (four) times daily. Patient not taking: Reported on 03/29/2015 11/01/13   Reuben Likes, MD  meloxicam (MOBIC) 7.5 MG tablet Take 1 tablet (7.5 mg total) by mouth daily. 05/21/15   Trixie Dredge, PA-C  methocarbamol (ROBAXIN) 500 MG tablet Take 1 tablet (500 mg total) by mouth 3 (three) times daily. 06/09/15  Linna Hoff, MD  sertraline (ZOLOFT) 100 MG tablet Take 1 tablet (100 mg total) by mouth daily. Patient not taking: Reported on 03/29/2015 07/19/13   Rodolph Bong, MD   Meds Ordered and Administered this Visit  Medications - No data to display  BP 124/86 mmHg  Pulse 95  Temp(Src) 97.8 F (36.6 C) (Oral)  Resp 18  SpO2 97% No data found.   Physical Exam  Constitutional: He is oriented to person, place, and time. He appears well-developed and  well-nourished. No distress.  Neck: Normal range of motion. Neck supple.  Cardiovascular: Regular rhythm, normal heart sounds and intact distal pulses.   Pulmonary/Chest: Effort normal and breath sounds normal. He exhibits tenderness. He exhibits no crepitus and no swelling.    Lymphadenopathy:    He has no cervical adenopathy.  Neurological: He is alert and oriented to person, place, and time.  Skin: Skin is warm and dry.  Nursing note and vitals reviewed.   ED Course  Procedures (including critical care time)  Labs Review Labs Reviewed - No data to display  Imaging Review No results found.   Visual Acuity Review  Right Eye Distance:   Left Eye Distance:   Bilateral Distance:    Right Eye Near:   Left Eye Near:    Bilateral Near:         MDM   1. Left-sided chest wall pain    rx robaxin 500, heat and rest.    Linna Hoff, MD 06/09/15 2131

## 2015-06-09 NOTE — ED Notes (Signed)
Pt  Reports  He  Was  Lifting  Weights   sev  Days  Ago  And  Felt a  Pop in his  l   Side       2  Days    Ago        He  Reports  Pain   When he  Coughs   Moves  On palpation

## 2016-02-09 ENCOUNTER — Encounter (HOSPITAL_COMMUNITY): Payer: Self-pay | Admitting: Emergency Medicine

## 2016-02-09 ENCOUNTER — Emergency Department (HOSPITAL_COMMUNITY)
Admission: EM | Admit: 2016-02-09 | Discharge: 2016-02-09 | Disposition: A | Discharge status: Home / Self Care | Payer: Non-veteran care | Attending: Emergency Medicine | Admitting: Emergency Medicine

## 2016-02-09 DIAGNOSIS — Z8659 Personal history of other mental and behavioral disorders: Secondary | ICD-10-CM | POA: Diagnosis not present

## 2016-02-09 DIAGNOSIS — S39011A Strain of muscle, fascia and tendon of abdomen, initial encounter: Secondary | ICD-10-CM | POA: Diagnosis not present

## 2016-02-09 DIAGNOSIS — Z8669 Personal history of other diseases of the nervous system and sense organs: Secondary | ICD-10-CM | POA: Diagnosis not present

## 2016-02-09 DIAGNOSIS — Y9289 Other specified places as the place of occurrence of the external cause: Secondary | ICD-10-CM | POA: Diagnosis not present

## 2016-02-09 DIAGNOSIS — Y9389 Activity, other specified: Secondary | ICD-10-CM | POA: Diagnosis not present

## 2016-02-09 DIAGNOSIS — X500XXA Overexertion from strenuous movement or load, initial encounter: Secondary | ICD-10-CM | POA: Insufficient documentation

## 2016-02-09 DIAGNOSIS — Y998 Other external cause status: Secondary | ICD-10-CM | POA: Insufficient documentation

## 2016-02-09 DIAGNOSIS — S3991XA Unspecified injury of abdomen, initial encounter: Secondary | ICD-10-CM | POA: Diagnosis present

## 2016-02-09 MED ORDER — METHOCARBAMOL 500 MG PO TABS: 500.0000 mg | ORAL_TABLET | Freq: Two times a day (BID) | ORAL | Status: AC

## 2016-02-09 MED ORDER — OXYCODONE-ACETAMINOPHEN 5-325 MG PO TABS
2.0000 | ORAL_TABLET | ORAL | Status: DC | PRN
Start: 2016-02-09 — End: 2017-10-07

## 2016-02-09 MED ORDER — NAPROXEN 500 MG PO TABS: 500.0000 mg | ORAL_TABLET | Freq: Two times a day (BID) | ORAL | Status: AC

## 2016-02-09 NOTE — ED Provider Notes (Signed)
CSN: [id number]     Arrival date & time 02/09/16  1308 History   First MD Initiated Contact with Patient 02/09/16 1007     Chief Complaint  Patient presents with  . Muscle Pain     HPI  Patient presents for evaluation of left side pain. Patient was doing an incline overhead presses morning with 375 pounds on the bar. He felt a pull and burning and now pain in his left upper abdominal wall. No direct contact or injury. Remains painful.  Past Medical History  Diagnosis Date  . Post traumatic stress disorder   . Sleep apnea   . Broken hip (HCC)   . Broken arm   . Hand injury    History reviewed. No pertinent past surgical history. History reviewed. No pertinent family history. Social History  Substance Use Topics  . Smoking status: Never Smoker   . Smokeless tobacco: None  . Alcohol Use: Yes    Review of Systems  Constitutional: Negative for fever, chills, diaphoresis, appetite change and fatigue.  HENT: Negative for mouth sores, sore throat and trouble swallowing.   Eyes: Negative for visual disturbance.  Respiratory: Negative for cough, chest tightness, shortness of breath and wheezing.   Cardiovascular: Negative for chest pain.  Gastrointestinal: Positive for abdominal pain. Negative for nausea, vomiting, diarrhea and abdominal distention.  Endocrine: Negative for polydipsia, polyphagia and polyuria.  Genitourinary: Negative for dysuria, frequency and hematuria.  Musculoskeletal: Negative for gait problem.  Skin: Negative for color change, pallor and rash.  Neurological: Negative for dizziness, syncope, light-headedness and headaches.  Hematological: Does not bruise/bleed easily.  Psychiatric/Behavioral: Negative for behavioral problems and confusion.      Allergies  Review of patient's allergies indicates no known allergies.  Home Medications   Prior to Admission medications   Medication Sig Start Date End Date Taking? Authorizing Provider  acetaminophen  (TYLENOL) 325 MG tablet Take 650 mg by mouth every 6 (six) hours as needed for mild pain.   Yes Historical Provider, MD  ibuprofen (ADVIL,MOTRIN) 200 MG tablet Take 200 mg by mouth every 6 (six) hours as needed for moderate pain.   Yes Historical Provider, MD  azithromycin (ZITHROMAX) 250 MG tablet Take 1 tablet (250 mg total) by mouth daily. Take first 2 tablets together, then 1 every day until finished. Patient not taking: Reported on 03/29/2015 07/19/13   Rodolph Bong, MD  guaiFENesin-codeine [facility]) 100-10 MG/5ML syrup Take 10 mLs by mouth 4 (four) times daily as needed for cough. Patient not taking: Reported on 03/29/2015 11/01/13   Reuben Likes, MD  guaiFENesin-codeine 100-10 MG/5ML syrup Take 5 mLs by mouth every 6 (six) hours as needed for cough. Patient not taking: Reported on 03/29/2015 07/19/13   Rodolph Bong, MD  HYDROcodone-acetaminophen (NORCO/VICODIN) 5-325 MG per tablet Take 1-2 tablets by mouth every 6 (six) hours as needed for severe pain. Patient not taking: Reported on 05/20/2015 03/29/15   Antony Madura, PA-C  ibuprofen (ADVIL,MOTRIN) 800 MG tablet Take 1 tablet (800 mg total) by mouth 3 (three) times daily. Patient not taking: Reported on 02/09/2016 06/09/15   Linna Hoff, MD  ipratropium (ATROVENT) 0.03 % nasal spray Place 2 sprays into the nose every 12 (twelve) hours. Patient not taking: Reported on 03/29/2015 07/19/13   Rodolph Bong, MD  ipratropium (ATROVENT) 0.06 % nasal spray Place 2 sprays into both nostrils 4 (four) times daily. Patient not taking: Reported on 03/29/2015 11/01/13   Reuben Likes, MD  meloxicam (  MOBIC) 7.5 MG tablet Take 1 tablet (7.5 mg total) by mouth daily. Patient not taking: Reported on 02/09/2016 05/21/15   Trixie DredgeEmily West, PA-C  methocarbamol (ROBAXIN) 500 MG tablet Take 1 tablet (500 mg total) by mouth 2 (two) times daily. 02/09/16   Rolland PorterMark Odilon Cass, MD  naproxen (NAPROSYN) 500 MG tablet Take 1 tablet (500 mg total) by mouth 2 (two) times daily. 02/09/16   Rolland PorterMark  Omer Monter, MD  oxyCODONE-acetaminophen (PERCOCET/ROXICET) 5-325 MG tablet Take 2 tablets by mouth every 4 (four) hours as needed. 02/09/16   Rolland PorterMark Reilley Latorre, MD  sertraline (ZOLOFT) 100 MG tablet Take 1 tablet (100 mg total) by mouth daily. Patient not taking: Reported on 03/29/2015 07/19/13   Rodolph BongEvan S Corey, MD   BP 133/77 mmHg  Pulse 85  Temp(Src) 97.9 F (36.6 C) (Oral)  Resp 18  Ht 5\' 7"  (1.702 m)  Wt 230 lb (104.327 kg)  BMI 36.01 kg/m2  SpO2 98% Physical Exam  Constitutional: He is oriented to person, place, and time. He appears well-developed and well-nourished. No distress.  HENT:  Head: Normocephalic.  Eyes: Conjunctivae are normal. Pupils are equal, round, and reactive to light. No scleral icterus.  Neck: Normal range of motion. Neck supple. No thyromegaly present.  Cardiovascular: Normal rate and regular rhythm.  Exam reveals no gallop and no friction rub.   No murmur heard. Pulmonary/Chest: Effort normal and breath sounds normal. No respiratory distress. He has no wheezes. He has no rales.  Abdominal: Soft. Bowel sounds are normal. He exhibits no distension. There is no tenderness. There is no rebound.    Musculoskeletal: Normal range of motion.  Neurological: He is alert and oriented to person, place, and time.  Skin: Skin is warm and dry. No rash noted.  Psychiatric: He has a normal mood and affect. His behavior is normal.    ED Course  Procedures (including critical care time) Labs Review Labs Reviewed - No data to display  Imaging Review No results found. I have personally reviewed and evaluated these images and lab results as part of my medical decision-making.   EKG Interpretation None      MDM   Final diagnoses:  Abdominal wall strain, initial encounter   Exam consistent with abdominal wall strain. No palpable obvious defect or ventral hernia. Advised avoiding heavy lifting limiting activities that exacerbate his symptoms. Pain control. Slow increase in  activity    Rolland PorterMark Leydi Winstead, MD 02/14/16 2055

## 2016-02-09 NOTE — Discharge Instructions (Signed)
Avoid upright activity for the next 48 hours. Avoid lifting over 5 pounds until you can do so without pain. Slowly increase your activity as your pain improves.  Muscle Strain A muscle strain (pulled muscle) happens when a muscle is stretched beyond normal length. It happens when a sudden, violent force stretches your muscle too far. Usually, a few of the fibers in your muscle are torn. Muscle strain is common in athletes. Recovery usually takes 1-2 weeks. Complete healing takes 5-6 weeks.  HOME CARE   Follow the PRICE method of treatment to help your injury get better. Do this the first 2-3 days after the injury:  Protect. Protect the muscle to keep it from getting injured again.  Rest. Limit your activity and rest the injured body part.  Ice. Put ice in a plastic bag. Place a towel between your skin and the bag. Then, apply the ice and leave it on from 15-20 minutes each hour. After the third day, switch to moist heat packs.  Compression. Use a splint or elastic bandage on the injured area for comfort. Do not put it on too tightly.  Elevate. Keep the injured body part above the level of your heart.  Only take medicine as told by your doctor.  Warm up before doing exercise to prevent future muscle strains. GET HELP IF:   You have more pain or puffiness (swelling) in the injured area.  You feel numbness, tingling, or notice a loss of strength in the injured area. MAKE SURE YOU:   Understand these instructions.  Will watch your condition.  Will get help right away if you are not doing well or get worse.   This information is not intended to replace advice given to you by your health care provider. Make sure you discuss any questions you have with your health care provider.   Document Released: 06/08/2008 Document Revised: 06/20/2013 Document Reviewed: 03/29/2013 Elsevier Interactive Patient Education [employer]2016 [employer] Inc.

## 2016-02-09 NOTE — ED Notes (Signed)
Bench pressing this morning around 0430 am. Lifted heavy weight and felt a stabbing sharp pain in left flank. Took 400mg  Motrin at that time; took Tylenol about 0730. No relief. Rates 9/10.

## 2016-02-19 IMAGING — CT CT HIP*L* W/O CM
2 of 5 series · 7 of 42 positions shown, 8 images · non-contrast
Comparison: Left hip radiographs 05/20/2015

CLINICAL DATA: Patient fell on left side 2 days ago. Pain. Remote
left hip fracture without repair.

EXAM:
CT OF THE LEFT HIP WITHOUT CONTRAST
TECHNIQUE: Multidetector CT imaging of the left hip was performed according to
the standard protocol. Multiplanar CT image reconstructions were
also generated.

[Series 202: soft tissue · axial · 0.37mm/px · z∈[+92,+246]mm · 4 of 122 slices shown, 5 images]
[im 23/122  soft-tissue]
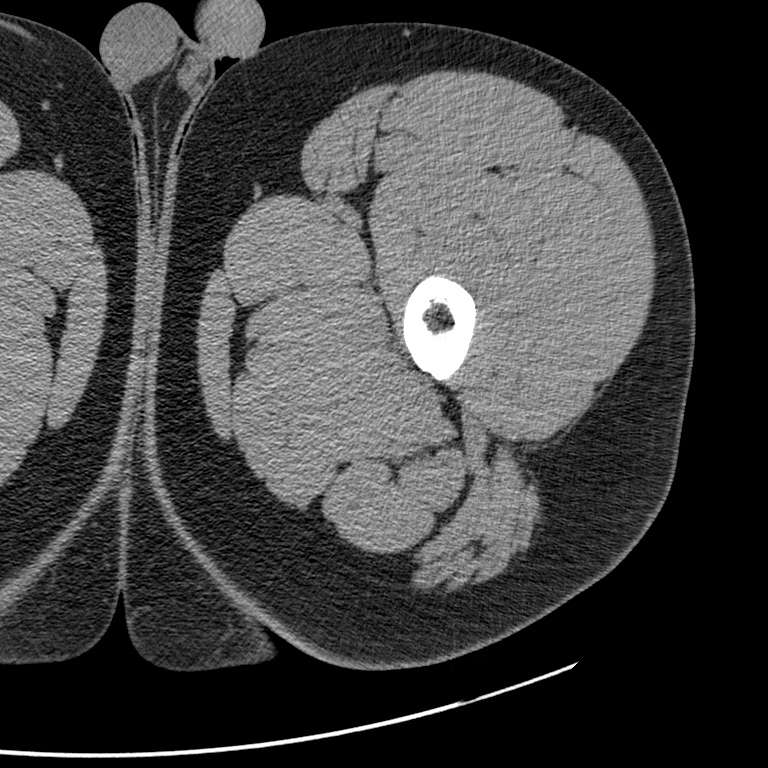
[im 23/122  bone]
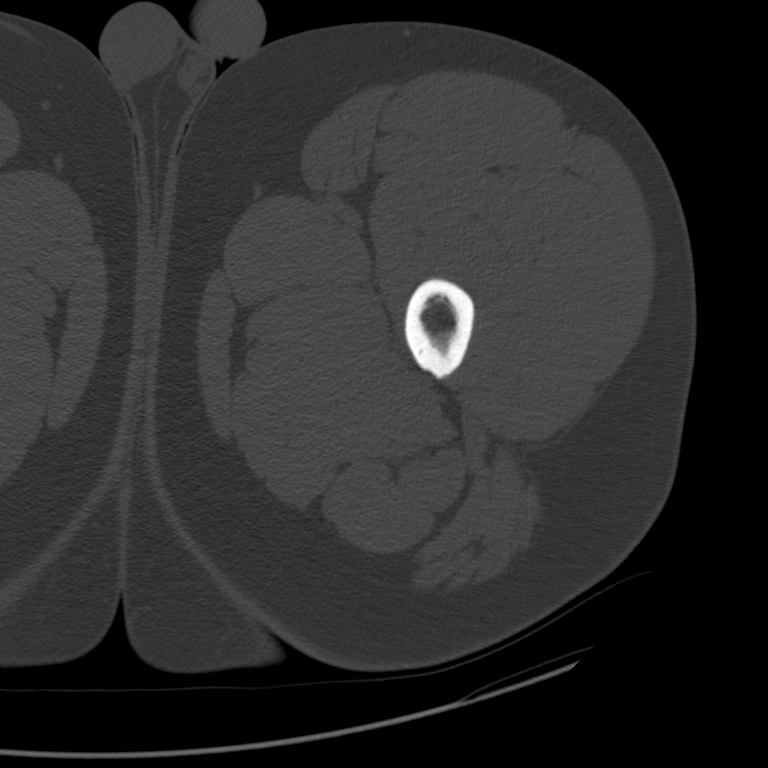
[im 45/122  soft-tissue]
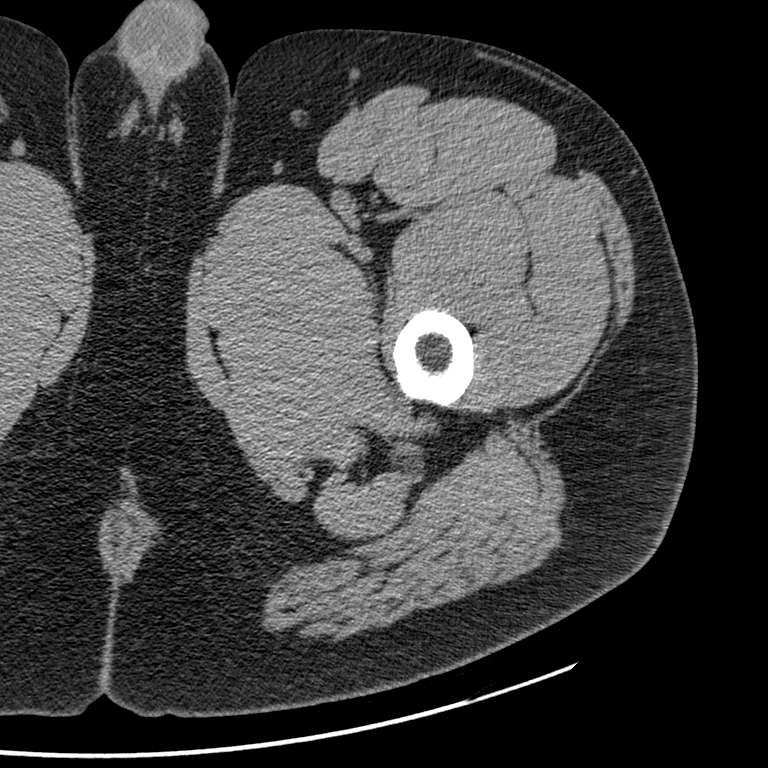
[im 78/122  soft-tissue]
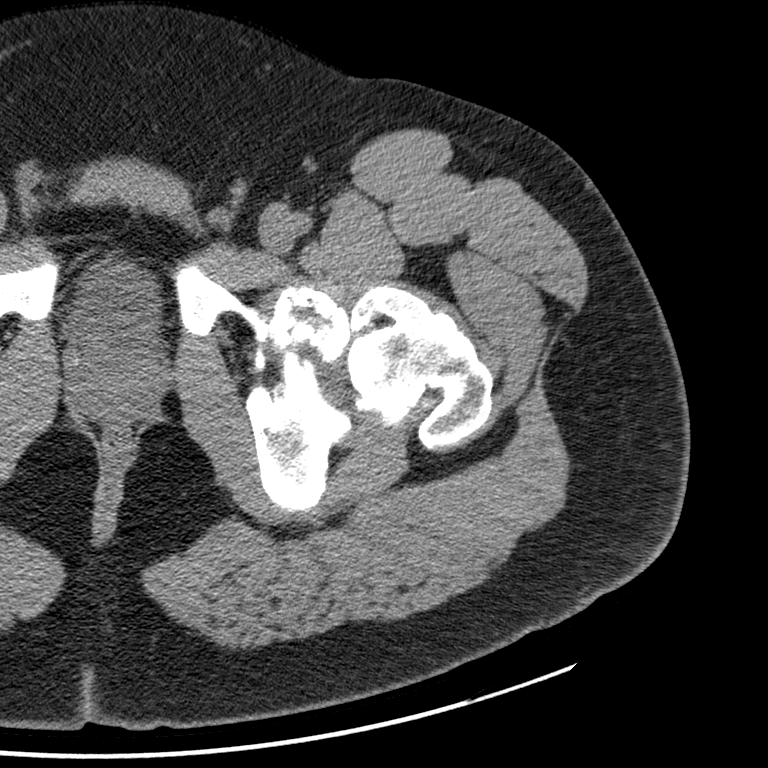
[im 100/122  soft-tissue]
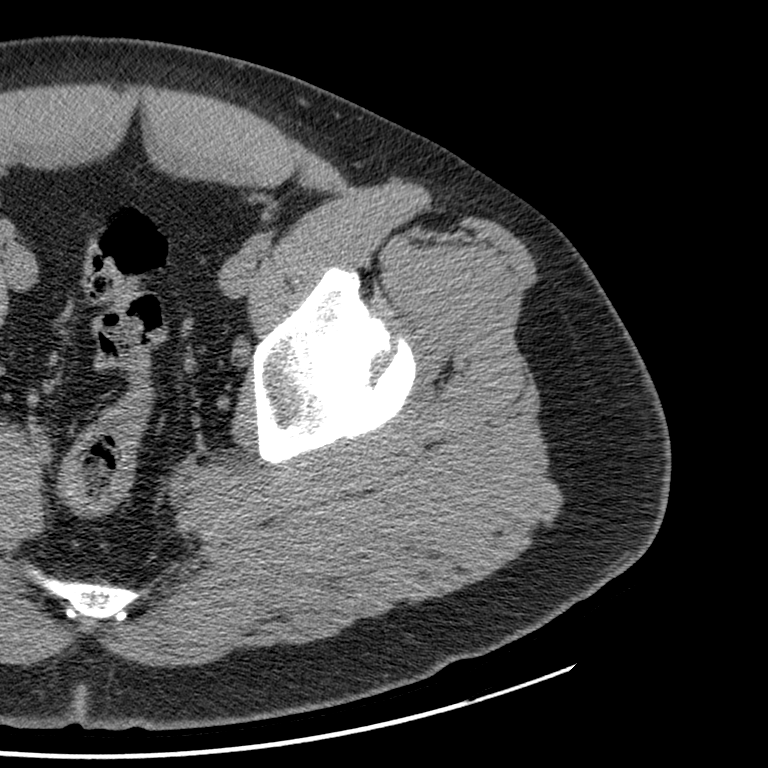

[Series 208: coronal soft tissue · coronal · 0.37mm/px · 3 of 82 slices shown]
[im 17/82  soft-tissue]
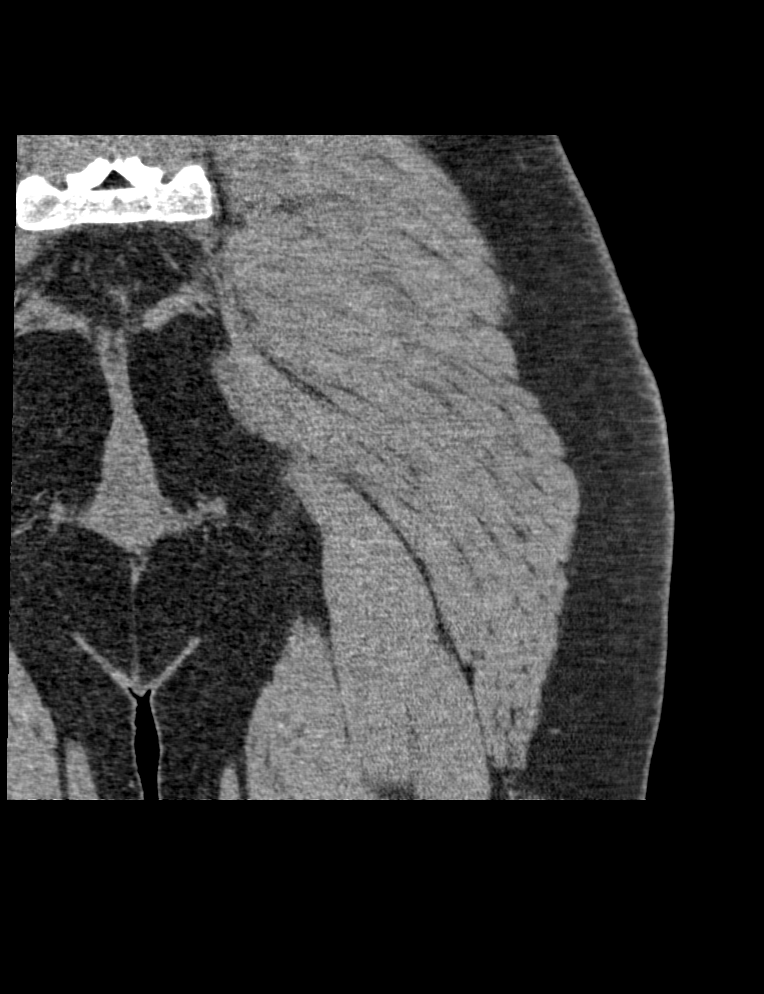
[im 33/82  soft-tissue]
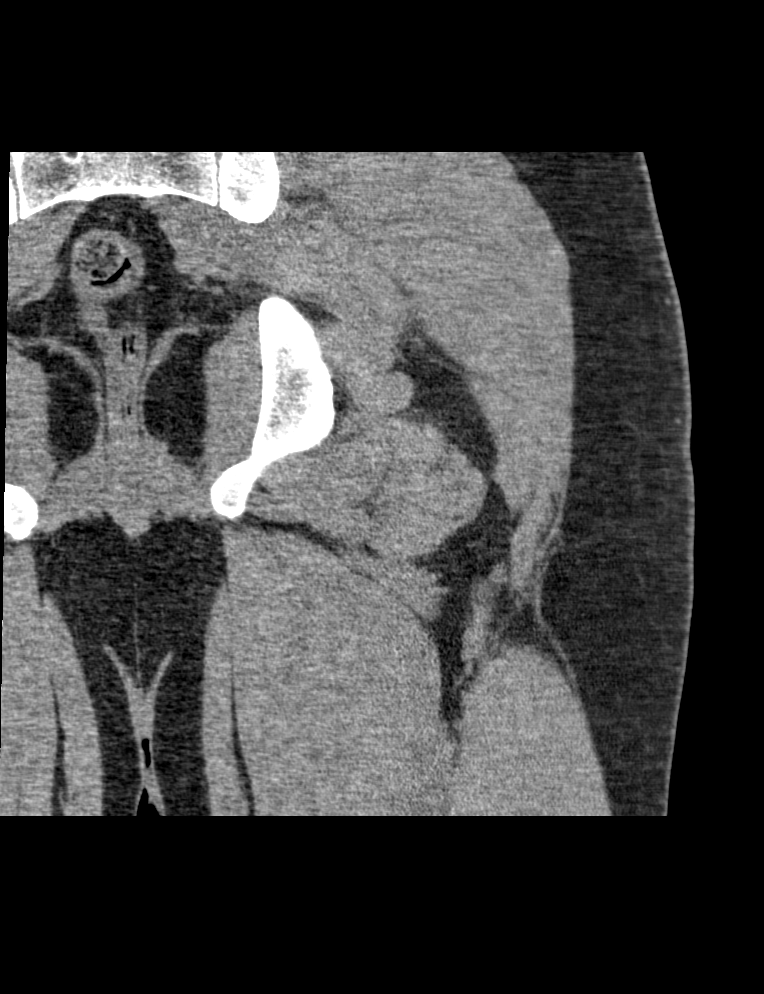
[im 49/82  soft-tissue]
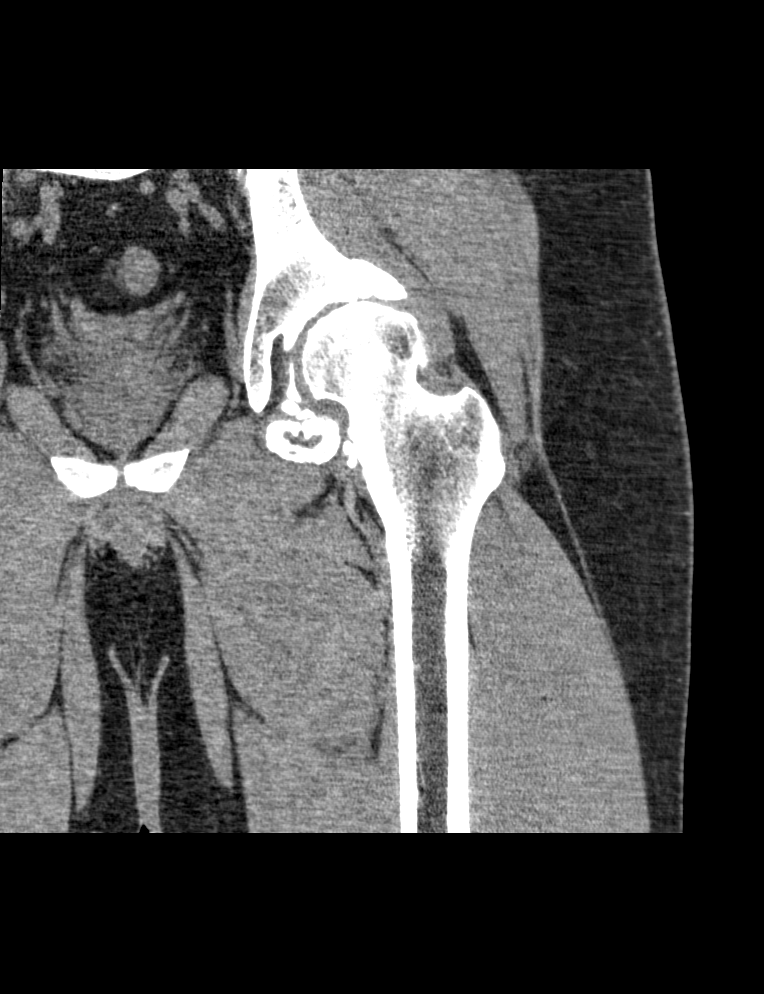

[7 of 42 positions shown; findings below may reference images not displayed]

FINDINGS: Severe degenerative changes in the left hip with loss of joint
space, bone remodeling, and prominent osteophytes on the acetabular
and femoral sides of the joint. Valgus angulation of the joint.
Heterotopic ossification is demonstrated around the left femoral
neck and medial to the left hip. Changes all appear chronic. There
is no evidence of acute fracture or dislocation. Visualized portion
of the left hemipelvis is intact. No surrounding soft tissue
swelling or hematoma. Degenerative changes in the left SI joint.
IMPRESSION: Prominent degenerative changes and heterotopic ossification about
the left hip. No acute fracture or dislocation identified.

## 2017-10-07 ENCOUNTER — Encounter: Payer: Self-pay | Admitting: Podiatry

## 2017-10-07 ENCOUNTER — Other Ambulatory Visit: Payer: Self-pay | Admitting: Podiatry

## 2017-10-07 ENCOUNTER — Ambulatory Visit (INDEPENDENT_AMBULATORY_CARE_PROVIDER_SITE_OTHER): Payer: Non-veteran care

## 2017-10-07 ENCOUNTER — Ambulatory Visit (INDEPENDENT_AMBULATORY_CARE_PROVIDER_SITE_OTHER): Payer: Non-veteran care | Admitting: Podiatry

## 2017-10-07 DIAGNOSIS — M2142 Flat foot [pes planus] (acquired), left foot: Secondary | ICD-10-CM | POA: Diagnosis not present

## 2017-10-07 DIAGNOSIS — M2141 Flat foot [pes planus] (acquired), right foot: Secondary | ICD-10-CM

## 2017-10-07 DIAGNOSIS — M779 Enthesopathy, unspecified: Secondary | ICD-10-CM | POA: Diagnosis not present

## 2017-10-07 DIAGNOSIS — L600 Ingrowing nail: Secondary | ICD-10-CM | POA: Diagnosis not present

## 2017-10-07 MED ORDER — HYDROCODONE-ACETAMINOPHEN 10-325 MG PO TABS: 1.0000 | ORAL_TABLET | Freq: Three times a day (TID) | ORAL | 0 refills | Status: AC | PRN

## 2017-10-07 NOTE — Patient Instructions (Signed)

## 2017-10-08 NOTE — Progress Notes (Signed)
Subjective:   Patient ID: Jeremiah Aguilar, male   DOB: 32 y.o.   MRN: [id number]   HPI Patient presents with severe ingrown toenails of the left big toe with nails that are damaged and states it has been this way for a long time and has gotten worse over the last 8 months.  States he cannot cut them they get real irritated with no drainage and he cannot wear shoe gear.  Patient also has arch pain bilateral that he was concerned about.  Patient does not smoke and likes to be active   Review of Systems  All other systems reviewed and are negative.       Objective:  Physical Exam  Constitutional: He appears well-developed and well-nourished.  Cardiovascular: Intact distal pulses.  Pulmonary/Chest: Effort normal.  Musculoskeletal: Normal range of motion.  Neurological: He is alert.  Skin: Skin is warm.  Nursing note and vitals reviewed.   Neurovascular status found to be intact with muscle strength adequate range of motion within normal limits.  Patient is found to have severe thickened incurvated hallux nails bilateral that upon dorsal palpation are very painful when pressed and make shoe gear difficult.  There is no redness or no drainage noted and patient does have moderate depression of the arch bilateral.  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  He wants nail removal and I explained procedure and risk associated with removal of the nailbeds and he is willing to accept this and the fact they are permanent procedures.  I went ahead and I infiltrated the hallux 60 mg like Marcaine mixture bilateral and then did sterile prep of the toes and using sterile instrumentation I removed the hallux nail exposed matrix and applied phenol 5 applications 30 seconds followed by alcohol lavage and sterile dressing.  Gave  instructions on soaks and reappoint.  Discussed arch and do not recommend any treatments except for supportive shoes and he can go to the [facility] to get orthotic devices

## 2017-12-05 ENCOUNTER — Telehealth: Payer: Self-pay | Admitting: *Deleted

## 2017-12-05 NOTE — Telephone Encounter (Signed)
Received fax requesting list of pt's narcotic medications, orders and date received. Return fax completed to [facility].

## 2018-02-08 ENCOUNTER — Ambulatory Visit (HOSPITAL_COMMUNITY)
Admission: EM | Admit: 2018-02-08 | Discharge: 2018-02-08 | Disposition: A | Discharge status: Home / Self Care | Payer: Non-veteran care | Attending: Family Medicine | Admitting: Family Medicine

## 2018-02-08 ENCOUNTER — Encounter (HOSPITAL_COMMUNITY): Payer: Self-pay | Admitting: Emergency Medicine

## 2018-02-08 DIAGNOSIS — G4730 Sleep apnea, unspecified: Secondary | ICD-10-CM | POA: Insufficient documentation

## 2018-02-08 DIAGNOSIS — J069 Acute upper respiratory infection, unspecified: Secondary | ICD-10-CM | POA: Diagnosis not present

## 2018-02-08 DIAGNOSIS — F4310 Post-traumatic stress disorder, unspecified: Secondary | ICD-10-CM | POA: Insufficient documentation

## 2018-02-08 DIAGNOSIS — Z79899 Other long term (current) drug therapy: Secondary | ICD-10-CM | POA: Insufficient documentation

## 2018-02-08 DIAGNOSIS — B9789 Other viral agents as the cause of diseases classified elsewhere: Secondary | ICD-10-CM | POA: Diagnosis not present

## 2018-02-08 DIAGNOSIS — R05 Cough: Secondary | ICD-10-CM | POA: Insufficient documentation

## 2018-02-08 LAB — POCT RAPID STREP A: Streptococcus, Group A Screen (Direct): NEGATIVE

## 2018-02-08 MED ORDER — HYDROCODONE-HOMATROPINE 5-1.5 MG/5ML PO SYRP: 5.0000 mL | ORAL_SOLUTION | Freq: Every day | ORAL | 0 refills | Status: AC

## 2018-02-08 MED ORDER — BENZONATATE 200 MG PO CAPS: 200.0000 mg | ORAL_CAPSULE | Freq: Three times a day (TID) | ORAL | 0 refills | Status: AC | PRN

## 2018-02-08 MED ORDER — CETIRIZINE HCL 10 MG PO CAPS: 10.0000 mg | ORAL_CAPSULE | Freq: Every day | ORAL | 0 refills | Status: AC

## 2018-02-08 MED ORDER — AZITHROMYCIN 250 MG PO TABS: 250.0000 mg | ORAL_TABLET | Freq: Every day | ORAL | 0 refills | Status: AC

## 2018-02-08 NOTE — ED Triage Notes (Signed)
Pt c/o throat pain, sore neck, bad headache, coughing "for weeks".

## 2018-02-08 NOTE — Discharge Instructions (Signed)
Symptoms most likely viral- please continue symptomatic management  Begin daily allergy pill like Zyrtec or Claritin, I have sent in Zyrtec, but you may also use store brand over-the-counter if this is cheaper.  This should help with congestion as well as any drainage contributing to your sore throat.  You may also supplement with no acute decongestant like Mucinex or Sudafed.  For cough please use Tessalon every 8 hours as needed, may use Hycodan cough syrup at bedtime.  This is hydrocodone in it, do not use during the day or when you need to drive.  Please reserve for bedtime.  If symptoms not improving in 3 to 4 days, you may fill the prescription for azithromycin.  Please follow-up if symptoms not improving or symptoms worsening or changing, developing shortness of breath or chest discomfort.

## 2018-02-09 NOTE — ED Provider Notes (Signed)
[facility]    CSN: [id number] Arrival date & time: 02/08/18  1718     History   Chief Complaint Chief Complaint  Patient presents with  . Cough    HPI Jeremiah Aguilar is a 32 y.o. male Patient is presenting with URI symptoms- congestion, cough, sore throat.  Patient also endorsing a couple episodes of diarrhea, this mainly occurs in the morning and does not persist throughout the day.  He has also had decreased appetite.  Denies abdominal pain.  Patient's main complaints are coughing at night, keeping him up at night.. Symptoms have been going on for 1 week. Patient has tried Benadryl, Claritin, Tylenol, TheraFlu, with minimal relief. Denies nausea, vomiting. Denies shortness of breath and chest pain.    HPI  Past Medical History:  Diagnosis Date  . Broken arm   . Broken hip (HCC)   . Hand injury   . Post traumatic stress disorder   . Sleep apnea     There are no active problems to display for this patient.   History reviewed. No pertinent surgical history.     Home Medications    Prior to Admission medications   Medication Sig Start Date End Date Taking? Authorizing Provider  acetaminophen (TYLENOL) 325 MG tablet Take 650 mg by mouth every 6 (six) hours as needed for mild pain.    [provider]  azithromycin (ZITHROMAX) 250 MG tablet Take 1 tablet (250 mg total) by mouth daily. Take first 2 tablets together, then 1 every day until finished. 02/08/18   Malone Vanblarcom C, PA-C  benzonatate (TESSALON) 200 MG capsule Take 1 capsule (200 mg total) by mouth 3 (three) times daily as needed for up to 7 days for cough. 02/08/18 02/15/18  Columbia Pandey C, PA-C  Cetirizine HCl 10 MG CAPS Take 1 capsule (10 mg total) by mouth daily for 15 days. 02/08/18 02/23/18  My Madariaga C, PA-C  HYDROcodone-acetaminophen (NORCO) 10-325 MG tablet Take 1 tablet by mouth every 8 (eight) hours as needed. Patient not taking: Reported on 02/08/2018 10/07/17   Lenn Sink,  DPM  HYDROcodone-homatropine [facility]) 5-1.5 MG/5ML syrup Take 5 mLs by mouth at bedtime. 02/08/18   Marsi Turvey C, PA-C  ibuprofen (ADVIL,MOTRIN) 200 MG tablet Take 200 mg by mouth every 6 (six) hours as needed for moderate pain.    [provider]  ibuprofen (ADVIL,MOTRIN) 800 MG tablet Take 1 tablet (800 mg total) by mouth 3 (three) times daily. 06/09/15   Linna Hoff, MD  ipratropium (ATROVENT) 0.03 % nasal spray Place 2 sprays into the nose every 12 (twelve) hours. Patient not taking: Reported on 02/08/2018 07/19/13   Rodolph Bong, MD  ipratropium (ATROVENT) 0.06 % nasal spray Place 2 sprays into both nostrils 4 (four) times daily. Patient not taking: Reported on 02/08/2018 11/01/13   Reuben Likes, MD  meloxicam (MOBIC) 7.5 MG tablet Take 1 tablet (7.5 mg total) by mouth daily. Patient not taking: Reported on 02/08/2018 05/21/15   Trixie Dredge, PA-C  methocarbamol (ROBAXIN) 500 MG tablet Take 1 tablet (500 mg total) by mouth 2 (two) times daily. Patient not taking: Reported on 02/08/2018 02/09/16   Rolland Porter, MD  naproxen (NAPROSYN) 500 MG tablet Take 1 tablet (500 mg total) by mouth 2 (two) times daily. Patient not taking: Reported on 02/08/2018 02/09/16   Rolland Porter, MD  sertraline (ZOLOFT) 100 MG tablet Take 1 tablet (100 mg total) by mouth daily. Patient not taking: Reported on 02/08/2018  07/19/13   Rodolph Bong, MD    Family History No family history on file.  Social History Social History   Tobacco Use  . Smoking status: Never Smoker  Substance Use Topics  . Alcohol use: Yes  . Drug use: No     Allergies   Patient has no known allergies.   Review of Systems Review of Systems  Constitutional: Positive for appetite change. Negative for activity change, fatigue and fever.  HENT: Positive for congestion, postnasal drip, rhinorrhea, sinus pressure and sore throat. Negative for ear pain.   Eyes: Negative for pain and itching.  Respiratory: Positive for cough.  Negative for shortness of breath.   Cardiovascular: Negative for chest pain.  Gastrointestinal: Positive for diarrhea. Negative for abdominal pain, nausea and vomiting.  Musculoskeletal: Positive for myalgias.  Skin: Negative for rash.  Neurological: Positive for headaches. Negative for dizziness and light-headedness.     Physical Exam Triage Vital Signs ED Triage Vitals [02/08/18 1726]  Enc Vitals Group     BP (!) 143/80     Pulse Rate 95     Resp 18     Temp 99.1 F (37.3 C)     Temp src      SpO2 100 %     Weight      Height      Head Circumference      Peak Flow      Pain Score      Pain Loc      Pain Edu?      Excl. in GC?    No data found.  Updated Vital Signs BP (!) 143/80   Pulse 95   Temp 99.1 F (37.3 C)   Resp 18   SpO2 100%   Visual Acuity Right Eye Distance:   Left Eye Distance:   Bilateral Distance:    Right Eye Near:   Left Eye Near:    Bilateral Near:     Physical Exam  Constitutional: He is oriented to person, place, and time. He appears well-developed and well-nourished.  HENT:  Head: Normocephalic and atraumatic.  Bilateral ears without tenderness to palpation of external auricle, tragus and mastoid, EAC's without erythema or swelling, TM's with good bony landmarks and cone of light. Non erythematous.  Oral mucosa pink and moist, no tonsillar enlargement or exudate. Posterior pharynx patent and erythematous, no uvula deviation or swelling. Normal phonation.  Eyes: Conjunctivae are normal.  Neck: Neck supple.  Cardiovascular: Normal rate and regular rhythm.  No murmur heard. Pulmonary/Chest: Effort normal and breath sounds normal. No respiratory distress.  Breathing comfortably at rest, CTABL, no wheezing, rales or other adventitious sounds auscultated  Abdominal: Soft. There is no tenderness.  Musculoskeletal: He exhibits no edema.  Neurological: He is alert and oriented to person, place, and time.  Skin: Skin is warm and dry.    Psychiatric: He has a normal mood and affect.  Nursing note and vitals reviewed.    UC Treatments / Results  Labs (all labs ordered are listed, but only abnormal results are displayed) Labs Reviewed  CULTURE, GROUP A STREP [facility])  POCT RAPID STREP A    EKG None  Radiology No results found.  Procedures Procedures (including critical care time)  Medications Ordered in UC Medications - No data to display  Initial Impression / Assessment and Plan / UC Course  I have reviewed the triage vital signs and the nursing notes.  Pertinent labs & imaging results that were available during my  care of the patient were reviewed by me and considered in my medical decision making (see chart for details).     Patient with URI symptoms, strep test negative.  Likely viral etiology.  Will recommend to continue symptomatic management, recommendations below.  Will provide Hycodan cough syrup to use only at nighttime.  Discussed sedation regarding this and advised to not use during the day.  Provided azithromycin to fill in 3 to 4 days if symptoms not improving with symptomatic management recommendations provided today.Discussed strict return precautions. Patient verbalized understanding and is agreeable with plan.  Final Clinical Impressions(s) / UC Diagnoses   Final diagnoses:  Viral URI with cough     Discharge Instructions     Symptoms most likely viral- please continue symptomatic management  Begin daily allergy pill like Zyrtec or Claritin, I have sent in Zyrtec, but you may also use store brand over-the-counter if this is cheaper.  This should help with congestion as well as any drainage contributing to your sore throat.  You may also supplement with no acute decongestant like Mucinex or Sudafed.  For cough please use Tessalon every 8 hours as needed, may use Hycodan cough syrup at bedtime.  This is hydrocodone in it, do not use during the day or when you need to drive.  Please reserve  for bedtime.  If symptoms not improving in 3 to 4 days, you may fill the prescription for azithromycin.  Please follow-up if symptoms not improving or symptoms worsening or changing, developing shortness of breath or chest discomfort.   ED Prescriptions    Medication Sig Dispense Auth. Provider   azithromycin (ZITHROMAX) 250 MG tablet Take 1 tablet (250 mg total) by mouth daily. Take first 2 tablets together, then 1 every day until finished. 6 tablet Corran Lalone C, PA-C   HYDROcodone-homatropine (HYCODAN) 5-1.5 MG/5ML syrup Take 5 mLs by mouth at bedtime. 50 mL Cherith Tewell C, PA-C   Cetirizine HCl 10 MG CAPS Take 1 capsule (10 mg total) by mouth daily for 15 days. 15 capsule Nairi Oswald C, PA-C   benzonatate (TESSALON) 200 MG capsule Take 1 capsule (200 mg total) by mouth 3 (three) times daily as needed for up to 7 days for cough. 28 capsule Ameerah Huffstetler C, PA-C     Controlled Substance Prescriptions [place] Controlled Substance Registry consulted? Not Applicable   Lew Dawes, [place] 02/09/18 [phone]

## 2018-02-11 LAB — CULTURE, GROUP A STREP (THRC)

## 2019-12-07 ENCOUNTER — Ambulatory Visit: Payer: Non-veteran care | Attending: Internal Medicine

## 2019-12-07 DIAGNOSIS — Z23 Encounter for immunization: Secondary | ICD-10-CM

## 2019-12-07 NOTE — Progress Notes (Signed)
   Covid-19 Vaccination Clinic  Name:  Thong Feeny    MRN: [id number] DOB: 1986-01-11  12/07/2019  Mr. Stanzione was observed post Covid-19 immunization for 15 minutes without incident. He was provided with Vaccine Information Sheet and instruction to access the V-Safe system.   Mr. Boateng was instructed to call 911 with any severe reactions post vaccine: Marland Kitchen Difficulty breathing  . Swelling of face and throat  . A fast heartbeat  . A bad rash all over body  . Dizziness and weakness   Immunizations Administered    Name Date Dose VIS Date Route   Moderna COVID-19 Vaccine 12/07/2019  8:03 AM 0.5 mL 08/14/2019 Intramuscular   Manufacturer: Moderna   Lot: [id number]   NDC: 36681-594-70

## 2020-01-09 ENCOUNTER — Ambulatory Visit: Payer: Non-veteran care | Attending: Internal Medicine

## 2020-01-09 DIAGNOSIS — Z23 Encounter for immunization: Secondary | ICD-10-CM

## 2020-01-09 NOTE — Progress Notes (Signed)
   Covid-19 Vaccination Clinic  Name:  Jeremiah Aguilar    MRN: [id number] DOB: 03/13/1986  01/09/2020  [name] was observed post Covid-19 immunization for 15 minutes without incident. He was provided with Vaccine Information Sheet and instruction to access the V-Safe system.   [name] was instructed to call 911 with any severe reactions post vaccine: Marland Kitchen Difficulty breathing  . Swelling of face and throat  . A fast heartbeat  . A bad rash all over body  . Dizziness and weakness   Immunizations Administered    Name Date Dose VIS Date Route   Moderna COVID-19 Vaccine 01/09/2020  8:24 AM 0.5 mL 08/2019 Intramuscular   Manufacturer: Moderna   Lot: [id number]   NDC: 99278-004-47
